# Patient Record
Sex: Male | Born: 1990 | Race: Black or African American | Hispanic: No | Marital: Single | Smoking: Never smoker
Health system: Southern US, Community
[De-identification: ages and names within clinical notes are randomized; demographics above are authoritative.]

## PROBLEM LIST (undated history)

## (undated) DIAGNOSIS — A539 Syphilis, unspecified: Secondary | ICD-10-CM

## (undated) DIAGNOSIS — J45909 Unspecified asthma, uncomplicated: Secondary | ICD-10-CM

## (undated) DIAGNOSIS — F419 Anxiety disorder, unspecified: Secondary | ICD-10-CM

## (undated) DIAGNOSIS — R630 Anorexia: Secondary | ICD-10-CM

## (undated) DIAGNOSIS — F329 Major depressive disorder, single episode, unspecified: Secondary | ICD-10-CM

## (undated) DIAGNOSIS — R001 Bradycardia, unspecified: Secondary | ICD-10-CM

## (undated) DIAGNOSIS — L309 Dermatitis, unspecified: Secondary | ICD-10-CM

## (undated) DIAGNOSIS — F32A Depression, unspecified: Secondary | ICD-10-CM

## (undated) HISTORY — PX: EXTERNAL EAR SURGERY: SHX627

## (undated) HISTORY — DX: Syphilis, unspecified: A53.9

---
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
## 2013-08-29 ENCOUNTER — Other Ambulatory Visit (HOSPITAL_COMMUNITY)
Admission: RE | Admit: 2013-08-29 | Discharge: 2013-08-29 | Disposition: A | Discharge status: Home / Self Care | Payer: Self-pay | Source: Ambulatory Visit | Attending: Family Medicine | Admitting: Family Medicine

## 2013-08-29 ENCOUNTER — Emergency Department (INDEPENDENT_AMBULATORY_CARE_PROVIDER_SITE_OTHER)
Admission: EM | Admit: 2013-08-29 | Discharge: 2013-08-29 | Disposition: A | Discharge status: Home / Self Care | Payer: Self-pay | Source: Home / Self Care | Attending: Family Medicine | Admitting: Family Medicine

## 2013-08-29 ENCOUNTER — Encounter (HOSPITAL_COMMUNITY): Payer: Self-pay | Admitting: Emergency Medicine

## 2013-08-29 DIAGNOSIS — R591 Generalized enlarged lymph nodes: Secondary | ICD-10-CM

## 2013-08-29 DIAGNOSIS — R599 Enlarged lymph nodes, unspecified: Secondary | ICD-10-CM

## 2013-08-29 DIAGNOSIS — Z113 Encounter for screening for infections with a predominantly sexual mode of transmission: Secondary | ICD-10-CM | POA: Insufficient documentation

## 2013-08-29 DIAGNOSIS — A539 Syphilis, unspecified: Secondary | ICD-10-CM

## 2013-08-29 HISTORY — DX: Syphilis, unspecified: A53.9

## 2013-08-29 HISTORY — DX: Unspecified asthma, uncomplicated: J45.909

## 2013-08-29 LAB — CBC WITH DIFFERENTIAL/PLATELET
Basophils Absolute: 0 10*3/uL (ref 0.0–0.1)
Basophils Relative: 1 % (ref 0–1)
Eosinophils Absolute: 0.1 10*3/uL (ref 0.0–0.7)
Eosinophils Relative: 3 % (ref 0–5)
HCT: 46.7 % (ref 39.0–52.0)
Hemoglobin: 15.7 g/dL (ref 13.0–17.0)
Lymphocytes Relative: 51 % — ABNORMAL HIGH (ref 12–46)
Lymphs Abs: 2.7 10*3/uL (ref 0.7–4.0)
MCH: 29.8 pg (ref 26.0–34.0)
MCHC: 33.6 g/dL (ref 30.0–36.0)
MCV: 88.6 fL (ref 78.0–100.0)
Monocytes Absolute: 0.6 10*3/uL (ref 0.1–1.0)
Monocytes Relative: 11 % (ref 3–12)
Neutro Abs: 1.7 10*3/uL (ref 1.7–7.7)
Neutrophils Relative %: 34 % — ABNORMAL LOW (ref 43–77)
Platelets: 266 10*3/uL (ref 150–400)
RBC: 5.27 MIL/uL (ref 4.22–5.81)
RDW: 12.9 % (ref 11.5–15.5)
WBC: 5.1 10*3/uL (ref 4.0–10.5)

## 2013-08-29 LAB — RPR TITER

## 2013-08-29 LAB — RPR: RPR Ser Ql: REACTIVE — AB

## 2013-08-29 LAB — HIV ANTIBODY (ROUTINE TESTING W REFLEX): HIV 1&2 Ab, 4th Generation: NONREACTIVE

## 2013-08-29 MED ORDER — DOXYCYCLINE HYCLATE 100 MG PO TABS: 100.0000 mg | ORAL_TABLET | Freq: Two times a day (BID) | ORAL | Status: DC

## 2013-08-29 NOTE — ED Provider Notes (Signed)
CSN: [id number]     Arrival date & time 08/29/13  1444 History   First MD Initiated Contact with Patient 08/29/13 1455     Chief Complaint  Patient presents with  . Mass   (Consider location/radiation/quality/duration/timing/severity/associated sxs/prior Treatment) HPI  Hip mass on R.: started 8 days ago. Getting larger. Becoming painful. Worse w/ ambulation. Improves w/ rest. Pain comes an dgoes but mass does not go away. Denies fever, rash, n/v. Denies sick contacts. Ibuprofen, adn tylenol w/o benefit. No trauma to hip or surounding area. 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 Asthma    History reviewed. No pertinent past surgical history. No family history on file. History  Substance Use Topics  . Smoking status: Not on file  . Smokeless tobacco: Not on file  . Alcohol Use: Not on file    Review of Systems Per HPI with all other pertinent systems negative.   Allergies  Peanuts  Home Medications   Prior to Admission medications   Medication Sig Start Date End Date Taking? Authorizing Provider  doxycycline (VIBRA-TABS) 100 MG tablet Take 1 tablet (100 mg total) by mouth 2 (two) times daily. 08/29/13   Ozella Rocksavid J Kethan Papadopoulos, MD   BP 124/63  Pulse 65  Temp(Src) 98 F (36.7 C) (Oral)  Resp 18  Ht 5\' 9"  (1.753 m)  Wt 135 lb (61.236 kg)  BMI 19.93 kg/m2  SpO2 100% Physical Exam  Constitutional: He is oriented to person, place, and time. He appears well-developed and well-nourished. No distress.  HENT:  Head: Normocephalic and atraumatic.  Eyes: EOM are normal. Pupils are equal, round, and reactive to light.  Neck: Normal range of motion.  Cardiovascular: Normal rate.   Pulmonary/Chest: Effort normal. No respiratory distress.  Abdominal: Soft.  Genitourinary:  R large grape sized inguinal mass that will not reduce, but is moveable. Inguinal canal patent on standing. L dime sized mass that is also ttp. No LE signs of infection    Musculoskeletal: Normal range of motion.  Neurological: He is alert and oriented to person, place, and time. He exhibits normal muscle tone.  Skin: Skin is warm. No rash noted. He is not diaphoretic.  Psychiatric: He has a normal mood and affect. His behavior is normal. Judgment and thought content normal.    ED Course  Procedures (including critical care time) Labs Review Labs Reviewed  HIV ANTIBODY (ROUTINE TESTING)  RPR  CBC WITH DIFFERENTIAL  URINE CYTOLOGY ANCILLARY ONLY    Imaging Review No results found.   MDM   1. Adenopathy    Likely bilateral inguinal adenopathy. Hernia unlikely as pt passing bowels, young and not a lot of heavy lifting and canal apparently open. Denies sex w/o condoms or recent infections.  GC, Chl, Trich, HIV, RPR Start Doxy, NSAIDs, Ice, Precautions given and all questions answered  Shelly Flattenavid Dinari Stgermaine, MD Family Medicine 08/29/2013, 3:40 PM      Ozella Rocksavid J Jalaysia Lobb, MD 08/29/13 [phone]

## 2013-08-29 NOTE — ED Notes (Signed)
C/o mass on right hip which was noticed 7-8 days ago States pain intense while walking or putting pressure on leg Denies any discharge

## 2013-08-29 NOTE — Discharge Instructions (Signed)
The cause of your swollen glands in your groin region is not clear but may be due to an infection. We will let you know if any of your lab work comes back abnormal Please start the doxycycline for infection Please use ice, rest and ibuprofen 600mg  every 6 hours for pain and swelling.  Please go to the emergency room if you pain does not improve or becomes worse with loss of bowel function.  Lymphadenopathy Lymphadenopathy means "disease of the lymph glands." But the term is usually used to describe swollen or enlarged lymph glands, also called lymph nodes. These are the bean-shaped organs found in many locations including the neck, underarm, and groin. Lymph glands are part of the immune system, which fights infections in your body. Lymphadenopathy can occur in just one area of the body, such as the neck, or it can be generalized, with lymph node enlargement in several areas. The nodes found in the neck are the most common sites of lymphadenopathy. CAUSES When your immune system responds to germs (such as viruses or bacteria ), infection-fighting cells and fluid build up. This causes the glands to grow in size. Usually, this is not something to worry about. Sometimes, the glands themselves can become infected and inflamed. This is called lymphadenitis. Enlarged lymph nodes can be caused by many diseases:  Bacterial disease, such as strep throat or a skin infection.  Viral disease, such as a common cold.  Other germs, such as Lyme disease, tuberculosis, or sexually transmitted diseases.  Cancers, such as lymphoma (cancer of the lymphatic system) or leukemia (cancer of the white blood cells).  Inflammatory diseases such as lupus or rheumatoid arthritis.  Reactions to medications. Many of the diseases above are rare, but important. This is why you should see your caregiver if you have lymphadenopathy. SYMPTOMS  Swollen, enlarged lumps in the neck, back of the head, or other  locations.  Tenderness.  Warmth or redness of the skin over the lymph nodes.  Fever. DIAGNOSIS Enlarged lymph nodes are often near the source of infection. They can help health care providers diagnose your illness. For instance:  Swollen lymph nodes around the jaw might be caused by an infection in the mouth.  Enlarged glands in the neck often signal a throat infection.  Lymph nodes that are swollen in more than one area often indicate an illness caused by a virus. Your caregiver will likely know what is causing your lymphadenopathy after listening to your history and examining you. Blood tests, x-rays, or other tests may be needed. If the cause of the enlarged lymph node cannot be found, and it does not go away by itself, then a biopsy may be needed. Your caregiver will discuss this with you. TREATMENT Treatment for your enlarged lymph nodes will depend on the cause. Many times the nodes will shrink to normal size by themselves, with no treatment. Antibiotics or other medicines may be needed for infection. Only take over-the-counter or prescription medicines for pain, discomfort, or fever as directed by your caregiver. HOME CARE INSTRUCTIONS Swollen lymph glands usually return to normal when the underlying medical condition goes away. If they persist, contact your health-care provider. He/she might prescribe antibiotics or other treatments, depending on the diagnosis. Take any medications exactly as prescribed. Keep any follow-up appointments made to check on the condition of your enlarged nodes. SEEK MEDICAL CARE IF:  Swelling lasts for more than two weeks.  You have symptoms such as weight loss, night sweats, fatigue, or fever that  does not go away.  The lymph nodes are hard, seem fixed to the skin, or are growing rapidly.  Skin over the lymph nodes is red and inflamed. This could mean there is an infection. SEEK IMMEDIATE MEDICAL CARE IF:  Fluid starts leaking from the area of the  enlarged lymph node.  You develop a fever of 102 F (38.9 C) or greater.  Severe pain develops (not necessarily at the site of a large lymph node).  You develop chest pain or shortness of breath.  You develop worsening abdominal pain. MAKE SURE YOU:  Understand these instructions.  Will watch your condition.  Will get help right away if you are not doing well or get worse. Document Released: 10/30/2007 Document Revised: 06/06/2013 Document Reviewed: 10/30/2007 [facility] Patient Information 2015 [place], [place]. This information is not intended to replace advice given to you by your health care provider. Make sure you discuss any questions you have with your health care provider.

## 2013-08-30 ENCOUNTER — Telehealth (HOSPITAL_COMMUNITY): Payer: Self-pay | Admitting: *Deleted

## 2013-08-30 LAB — FLUORESCENT TREPONEMAL AB(FTA)-IGG-BLD: FLUORESCENT TREPONEMAL AB, IGG: REACTIVE — AB

## 2013-08-30 NOTE — ED Notes (Signed)
GC/Chlamydia/Trich neg., HIV non-reactive, RPR reactive, RPR titer 1:2, Fluorescent Treponemal IgG reactive.  Dr. Konrad DoloresMerrell said to call pt. back for Penicillin 2.4 million units.  I tried to call pt. but no answer.  Unable to leave a message.  Call 1.  No other contact numbers. Vassie MoselleYork, Osten Janek M 08/30/2013

## 2013-09-02 NOTE — ED Notes (Addendum)
Pt. needs treatment for Syphilis.  Unable to reach pt. by phone x 3- no answer ([facility] number).  Confidential letter sent with instructions to return for treatment. Vassie MoselleYork, Radin Raptis M 09/02/2013 Merrilee [facility]Jason Hall [facility] called and said he was trying to track pt. down. He asked if pt. Has come in yet for treatment. I said no.  I sent him a letter on 7/31 but he has not come in yet. He said he or Prentice DockerBrandi Sessoms would call back and let me know if [facility]. Vassie MoselleYork, Jentry Warnell M 09/08/2013

## 2013-09-08 ENCOUNTER — Encounter (HOSPITAL_COMMUNITY): Payer: Self-pay | Admitting: Emergency Medicine

## 2013-09-08 ENCOUNTER — Emergency Department (INDEPENDENT_AMBULATORY_CARE_PROVIDER_SITE_OTHER)
Admission: EM | Admit: 2013-09-08 | Discharge: 2013-09-08 | Disposition: A | Discharge status: Home / Self Care | Payer: Self-pay | Source: Home / Self Care | Attending: Emergency Medicine | Admitting: Emergency Medicine

## 2013-09-08 DIAGNOSIS — A510 Primary genital syphilis: Secondary | ICD-10-CM

## 2013-09-08 MED ORDER — PENICILLIN G BENZATHINE 1200000 UNIT/2ML IM SUSP
INTRAMUSCULAR | Status: AC
  Filled 2013-09-08: qty 4

## 2013-09-08 MED ORDER — PENICILLIN G BENZATHINE 1200000 UNIT/2ML IM SUSP
2.4000 10*6.[IU] | Freq: Once | INTRAMUSCULAR | Status: AC
  Administered 2013-09-08: 2.4 10*6.[IU] via INTRAMUSCULAR

## 2013-09-08 NOTE — Discharge Instructions (Signed)
[facility] in [facility] month [facility] or with primary care for repeat titer. No intercourse for 2 weeks and then always with condom.  Repeat HIV test in 3 and 6 months.

## 2013-09-08 NOTE — ED Provider Notes (Addendum)
Chief Complaint   Chief Complaint  Patient presents with  . SEXUALLY TRANSMITTED DISEASE    History of Present Illness   Stanley Moon is a 23 year old male who was seen here on July 27 for an enlarged lymph node in the right inguinal area. Serologies for syphilis, HIV, and urine testing for gonorrhea and Chlamydia were obtained. He was placed on doxycycline. The RPR came back positive at a 1:2 titer. A confirmatory test was positive for fluorescent treponemal antibody. His HIV titer and DNA probes for gonorrhea and Chlamydia were negative. He received our letter in the mail today and came right over here for treatment. The infection has been reported to the health department, and they're on their way over here to interview him. He also has had a two-week history of a sore on his penis. This is mildly tender to palpation. He denies any urethral discharge or dysuria the patient estimates that he has had 5 male partners in the past 3 months. He denies any prior history of STDs.  Review of Systems   Other than as noted above, the patient denies any of the following symptoms: Systemic:  No fevers chills, arthralgias, or adenopathy. GI:  No abdominal pain, nausea or vomiting. GU:  No dysuria, penile pain, discharge, itching, dysuria, genital lesions, testicular pain or swelling. Skin:  No rash or itching.  PMFSH   Past medical history, family history, social history, meds, and allergies were reviewed.   Physical Examination    Vital signs:  BP 114/70  Pulse 72  Temp(Src) 98.6 F (37 C) (Oral)  Resp 18  SpO2 100% Gen:  Alert, oriented, in no distress. Abdomen:  Soft and flat, non-distended, and non-tender.  No organomegaly or mass. Genital:  There is a 1 cm, mildly tender ulceration on the right side of the glans penis. No urethral discharge. No other penile lesions there is a 1 cm, tender, freely movable lymph node in the right inguinal area. Skin:  Warm and dry.  No rash.   Labs    Results for orders placed during the hospital encounter of 08/29/13  HIV ANTIBODY (ROUTINE TESTING)      Result Value Ref Range   HIV 1&2 Ab, 4th Generation NONREACTIVE  NONREACTIVE  RPR      Result Value Ref Range   RPR Reactive (*) NON REAC  CBC WITH DIFFERENTIAL      Result Value Ref Range   WBC 5.1  4.0 - 10.5 K/uL   RBC 5.27  4.22 - 5.81 MIL/uL   Hemoglobin 15.7  13.0 - 17.0 g/dL   HCT 16.1  09.6 - 04.5 %   MCV 88.6  78.0 - 100.0 fL   MCH 29.8  26.0 - 34.0 pg   MCHC 33.6  30.0 - 36.0 g/dL   RDW 40.9  81.1 - 91.4 %   Platelets 266  150 - 400 K/uL   Neutrophils Relative % 34 (*) 43 - 77 %   Neutro Abs 1.7  1.7 - 7.7 K/uL   Lymphocytes Relative 51 (*) 12 - 46 %   Lymphs Abs 2.7  0.7 - 4.0 K/uL   Monocytes Relative 11  3 - 12 %   Monocytes Absolute 0.6  0.1 - 1.0 K/uL   Eosinophils Relative 3  0 - 5 %   Eosinophils Absolute 0.1  0.0 - 0.7 K/uL   Basophils Relative 1  0 - 1 %   Basophils Absolute 0.0  0.0 - 0.1 K/uL  RPR  TITER      Result Value Ref Range   RPR Titer 1:2 (*) NON REACTIVE  FLUORESCENT TREPONEMAL AB(FTA)-IGG-BLD      Result Value Ref Range   Fluorescent Treponemal AB, IgG Reactive (*)      Course in Urgent Care Center   Given LA Bicillin 2.4 million units IM.   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  No evidence of secondary or tertiary syphilis.  Plan    1.  Meds:  The following meds were prescribed:   New Prescriptions   No medications on file    2.  Patient Education/Counseling:  The patient was given appropriate handouts, self care instructions, and instructed in symptomatic relief.The patient was instructed to inform all sexual contacts, avoid intercourse completely for 2 weeks and then only with a condom.  The patient was told that we would call about all abnormal lab results, and that we would need to report certain kinds of infection to the health department.  Suggested that he return to the health department in one  month for repeat titer. Also should avoid intercourse completely for the next 2 weeks, thereafter always use a condom.  3.  Follow up:  The patient was told to follow up here if no better in 3 to 4 days, or sooner if becoming worse in any way, and given some red flag symptoms such as fever, pain, or difficulty urinating which would prompt immediate return.       Reuben Likesavid C Jaramiah Bossard, MD 09/08/13 1640  Reuben Likesavid C Erandy Mceachern, MD 09/08/13 [phone]

## 2013-09-08 NOTE — ED Notes (Signed)
I called [facility] and notified her that pt was here now for treatment with 2.4 million units of PCN.  She will add that to his form. I told her that Merrilee JanskyJason Hall was coming here to talk to the pt. Vassie MoselleYork, Danyella Mcginty M 09/08/2013

## 2013-09-08 NOTE — ED Notes (Signed)
Investigator  From  Health       Dept  In     To  Speak  With  Patient

## 2013-09-08 NOTE — ED Notes (Signed)
I called Stanley JanskyJason Moon back and let him know pt. had just checked in and was here now for treatment.  He said he would be her in 10 min. to interview him. Stanley Moon, Stanley Moon 09/08/2013

## 2013-09-08 NOTE — ED Notes (Signed)
Plan of  Care  Discussed  With   Pt   Who  Verbalizes  Knowledge

## 2013-09-08 NOTE — ED Notes (Signed)
Pt        Reports      He  Was  Told  By  Health  Dept  He  Needed  To  Be  Treated  For a  Pos  Vdrl

## 2013-09-09 LAB — RPR TITER: RPR Titer: 1:16 {titer} — AB

## 2013-09-09 LAB — RPR: RPR: REACTIVE — AB

## 2013-09-09 NOTE — ED Notes (Addendum)
The RPR was  redrawn on 8/6 and was reactive.  The titer went up to 1:16.  Pt. was treated on 8/6 with 2.4 million units of PCN. Stanley Moon, Stanley Moon M 09/09/2013 Stanley DockerBrandi Moon [facility] notified of increased titer done on 8/6.  She will add it to current form. Stanley Moon, Archibald Marchetta M 09/12/2013

## 2013-09-12 LAB — FLUORESCENT TREPONEMAL AB(FTA)-IGG-BLD: FLUORESCENT TREPONEMAL AB, IGG: REACTIVE — AB

## 2015-11-04 ENCOUNTER — Encounter (HOSPITAL_COMMUNITY): Payer: Self-pay

## 2015-11-04 ENCOUNTER — Emergency Department (HOSPITAL_COMMUNITY)
Admission: EM | Admit: 2015-11-04 | Discharge: 2015-11-05 | Disposition: A | Discharge status: Other Acute Inpatient Hospital | Payer: Federal, State, Local not specified - Other | Attending: Emergency Medicine | Admitting: Emergency Medicine

## 2015-11-04 DIAGNOSIS — Z91010 Allergy to peanuts: Secondary | ICD-10-CM | POA: Insufficient documentation

## 2015-11-04 DIAGNOSIS — J45909 Unspecified asthma, uncomplicated: Secondary | ICD-10-CM | POA: Insufficient documentation

## 2015-11-04 DIAGNOSIS — F322 Major depressive disorder, single episode, severe without psychotic features: Secondary | ICD-10-CM | POA: Diagnosis present

## 2015-11-04 DIAGNOSIS — Z79899 Other long term (current) drug therapy: Secondary | ICD-10-CM | POA: Insufficient documentation

## 2015-11-04 NOTE — ED Provider Notes (Signed)
[facility]-EMERGENCY DEPT Provider Note   CSN: [id number] Arrival date & time: 11/04/15  2112  By signing my name below, I, Rosario Adie, attest that this documentation has been prepared under the direction and in the presence of Earley Favor, FNP. Electronically Signed: Rosario Adie, ED Scribe. 11/04/15. 10:02 PM.  History   Chief Complaint Chief Complaint  Patient presents with  . IVC   The history is provided by the patient. No language interpreter was used.   HPI Comments: Stanley Moon is a 25 y.o. male with no other pertinent PMHx, who presents to the Emergency Department brought in under IVC for evaluation. Pt notes that his mother has been recently very concerned about him. He states that she thinks he has psychiatric issues and him to speak to a psychiatrist. When pressed, pt states that his mother very worried because he does not eat enough daily. However, he denies any recent weight change or decreased appetite and notes that he has had normal PO intake otherwise. He does not currently take medications. No recent illnesses. Denies illicit drug or alcohol usage.   Per IVC paperwork, mother petitioned to place the pt under IVC because he has been increasingly more withdrawn from his life lately and has she has noticed that he has lost ~35lbs over the past 4-5 months. Today, prior to his arrival in the ED, mother found a letter that stated "majority of the time I want to commit suicide". Additional, his paperwork notes that he has been smoking marijuana recently.   Denies self-injury, wound involvement, pain otherwise, sleep disturbance, SI/HI, auditory/visual hallucinations, or any other associated symptoms.   Past Medical History:  Diagnosis Date  . Asthma    as a child, "I haven't used inhalers in years"   Patient Active Problem List   Diagnosis Date Noted  . Major depressive disorder, single episode, severe (HCC) 11/05/2015  . Major depressive disorder, single  episode, severe without psychotic features (HCC) 11/05/2015  . Anorexia nervosa 11/05/2015   History reviewed. No pertinent surgical history.  Home Medications    Prior to Admission medications   Medication Sig Start Date End Date Taking? Authorizing Provider  Multiple Vitamin (MULTIVITAMIN WITH MINERALS) TABS tablet Take 2 tablets by mouth at bedtime.   Yes Historical Provider, MD  doxycycline (VIBRA-TABS) 100 MG tablet Take 1 tablet (100 mg total) by mouth 2 (two) times daily. Patient not taking: Reported on 11/04/2015 08/29/13   Ozella Rocks, MD   Family History No family history on file.  Social History Social History  Substance Use Topics  . Smoking status: Never Smoker  . Smokeless tobacco: Never Used  . Alcohol use No   Allergies   Peanuts [peanut oil] and Lactose intolerance (gi)  Review of Systems Review of Systems  Constitutional: Negative for appetite change and unexpected weight change.  HENT: Negative for ear pain.   Eyes: Negative for pain.  Cardiovascular: Negative for chest pain.  Gastrointestinal: Negative for abdominal pain and rectal pain.  Genitourinary: Negative for flank pain, penile pain and testicular pain.  Musculoskeletal: Negative for arthralgias, back pain, myalgias and neck pain.  Skin: Negative for wound.  Neurological: Negative for headaches.  Psychiatric/Behavioral: Negative for hallucinations, self-injury, sleep disturbance and suicidal ideas.  All other systems reviewed and are negative.  Physical Exam Updated Vital Signs BP 120/71   Pulse 63   Temp 98.1 F (36.7 C)   Resp 16   Ht 5\' 10"  (1.778 m)   SpO2 100%  Physical Exam  Constitutional: He appears well-developed and well-nourished.  HENT:  Head: Normocephalic.  Eyes: Conjunctivae are normal.  Cardiovascular: Normal rate.   Pulmonary/Chest: Effort normal. No respiratory distress.  Abdominal: He exhibits no distension.  Musculoskeletal: Normal range of motion.    Neurological: He is alert.  Skin: Skin is warm and dry.  Psychiatric: He has a normal mood and affect. His behavior is normal.  Nursing note and vitals reviewed.  ED Treatments / Results  DIAGNOSTIC STUDIES: Oxygen Saturation is 100% on RA, normal by my interpretation.   COORDINATION OF CARE: 9:59 PM-Discussed next steps with pt. Pt verbalized understanding and is agreeable with the plan.   Labs (all labs ordered are listed, but only abnormal results are displayed) Labs Reviewed  URINE RAPID DRUG SCREEN, HOSP PERFORMED - Abnormal; Notable for the following:       Result Value   Tetrahydrocannabinol POSITIVE (*)    All other components within normal limits  HEPATIC FUNCTION PANEL - Abnormal; Notable for the following:    Total Protein 8.8 (*)    Albumin 5.1 (*)    Bilirubin, Direct <0.1 (*)    All other components within normal limits  I-STAT CHEM 8, ED - Abnormal; Notable for the following:    Chloride 98 (*)    All other components within normal limits  CBC WITH DIFFERENTIAL/PLATELET  ETHANOL  TSH  T3  T4  RAPID HIV SCREEN (HIV 1/2 AB+AG)  RPR    EKG  EKG Interpretation None      Radiology No results found.  Procedures Procedures (including critical care time)  Medications Ordered in ED Medications - No data to display  Initial Impression / Assessment and Plan / ED Course  I have reviewed the triage vital signs and the nursing notes.  Pertinent labs & imaging results that were available during my care of the patient were reviewed by me and considered in my medical decision making (see chart for details).  Clinical Course      Final Clinical Impressions(s) / ED Diagnoses   Final diagnoses:  Major depressive disorder, single episode, severe (HCC)   New Prescriptions Discharge Medication List as of 11/05/2015  1:49 PM      I personally performed the services described in this documentation, which was scribed in my presence. The recorded  information has been reviewed and is accurate.    Earley FavorGail Breeona Waid, NP 11/07/15 [phone]    Alvira MondayErin Schlossman, MD 11/08/15 1308

## 2015-11-04 NOTE — ED Notes (Signed)
Bed: [facility] Expected date:  Expected time:  Means of arrival:  Comments: 

## 2015-11-04 NOTE — ED Triage Notes (Signed)
Patient IVC'd by mom.  States that patient does not have a Hx of mental illness but, patient is withdraw, sleeps on the floor, and does not communicate with others.  Mother found a poem that "I want to commit suicide" Letter/Poem is with patients papers.  Mother states that patient has lost 35 pounds in the last 4 or 5 months.

## 2015-11-04 NOTE — ED Notes (Signed)
Mother Brayton CavesZarassa Zee Smigel [phone] and father Arrie SenateBrian Clay Morgan [phone]

## 2015-11-05 ENCOUNTER — Inpatient Hospital Stay (HOSPITAL_COMMUNITY)
Admission: AD | Admit: 2015-11-05 | Discharge: 2015-11-15 | DRG: 885 | Disposition: A | Discharge status: Home / Self Care | Payer: Federal, State, Local not specified - Other | Attending: Psychiatry | Admitting: Psychiatry

## 2015-11-05 ENCOUNTER — Encounter (HOSPITAL_COMMUNITY): Payer: Self-pay

## 2015-11-05 DIAGNOSIS — F411 Generalized anxiety disorder: Secondary | ICD-10-CM | POA: Diagnosis present

## 2015-11-05 DIAGNOSIS — F5001 Anorexia nervosa, restricting type: Secondary | ICD-10-CM | POA: Diagnosis present

## 2015-11-05 DIAGNOSIS — F322 Major depressive disorder, single episode, severe without psychotic features: Secondary | ICD-10-CM | POA: Diagnosis present

## 2015-11-05 DIAGNOSIS — Z681 Body mass index (BMI) 19 or less, adult: Secondary | ICD-10-CM | POA: Diagnosis not present

## 2015-11-05 DIAGNOSIS — Z91010 Allergy to peanuts: Secondary | ICD-10-CM | POA: Diagnosis not present

## 2015-11-05 DIAGNOSIS — I951 Orthostatic hypotension: Secondary | ICD-10-CM | POA: Diagnosis present

## 2015-11-05 DIAGNOSIS — R64 Cachexia: Secondary | ICD-10-CM | POA: Diagnosis present

## 2015-11-05 DIAGNOSIS — G4700 Insomnia, unspecified: Secondary | ICD-10-CM | POA: Diagnosis present

## 2015-11-05 DIAGNOSIS — F519 Sleep disorder not due to a substance or known physiological condition, unspecified: Secondary | ICD-10-CM | POA: Diagnosis present

## 2015-11-05 DIAGNOSIS — F5000 Anorexia nervosa, unspecified: Secondary | ICD-10-CM | POA: Diagnosis present

## 2015-11-05 DIAGNOSIS — R45851 Suicidal ideations: Secondary | ICD-10-CM

## 2015-11-05 DIAGNOSIS — Z79899 Other long term (current) drug therapy: Secondary | ICD-10-CM | POA: Diagnosis not present

## 2015-11-05 LAB — RAPID URINE DRUG SCREEN, HOSP PERFORMED
Amphetamines: NOT DETECTED
BARBITURATES: NOT DETECTED
BENZODIAZEPINES: NOT DETECTED
COCAINE: NOT DETECTED
OPIATES: NOT DETECTED
Tetrahydrocannabinol: POSITIVE — AB

## 2015-11-05 LAB — I-STAT CHEM 8, ED
BUN: 7 mg/dL (ref 6–20)
CREATININE: 0.8 mg/dL (ref 0.61–1.24)
Calcium, Ion: 1.23 mmol/L (ref 1.15–1.40)
Chloride: 98 mmol/L — ABNORMAL LOW (ref 101–111)
Glucose, Bld: 75 mg/dL (ref 65–99)
HCT: 47 % (ref 39.0–52.0)
Hemoglobin: 16 g/dL (ref 13.0–17.0)
POTASSIUM: 3.7 mmol/L (ref 3.5–5.1)
SODIUM: 139 mmol/L (ref 135–145)
TCO2: 29 mmol/L (ref 0–100)

## 2015-11-05 LAB — CBC WITH DIFFERENTIAL/PLATELET
BASOS ABS: 0 10*3/uL (ref 0.0–0.1)
BASOS PCT: 1 %
EOS ABS: 0.1 10*3/uL (ref 0.0–0.7)
Eosinophils Relative: 3 %
HEMATOCRIT: 42.1 % (ref 39.0–52.0)
HEMOGLOBIN: 15.1 g/dL (ref 13.0–17.0)
Lymphocytes Relative: 47 %
Lymphs Abs: 2.5 10*3/uL (ref 0.7–4.0)
MCH: 29.7 pg (ref 26.0–34.0)
MCHC: 35.9 g/dL (ref 30.0–36.0)
MCV: 82.7 fL (ref 78.0–100.0)
Monocytes Absolute: 0.3 10*3/uL (ref 0.1–1.0)
Monocytes Relative: 5 %
NEUTROS ABS: 2.3 10*3/uL (ref 1.7–7.7)
NEUTROS PCT: 44 %
Platelets: 205 10*3/uL (ref 150–400)
RBC: 5.09 MIL/uL (ref 4.22–5.81)
RDW: 13.2 % (ref 11.5–15.5)
WBC: 5.3 10*3/uL (ref 4.0–10.5)

## 2015-11-05 LAB — TSH: TSH: 1.685 u[IU]/mL (ref 0.350–4.500)

## 2015-11-05 LAB — HEPATIC FUNCTION PANEL
ALBUMIN: 5.1 g/dL — AB (ref 3.5–5.0)
ALT: 23 U/L (ref 17–63)
AST: 26 U/L (ref 15–41)
Alkaline Phosphatase: 49 U/L (ref 38–126)
Total Bilirubin: 1.2 mg/dL (ref 0.3–1.2)
Total Protein: 8.8 g/dL — ABNORMAL HIGH (ref 6.5–8.1)

## 2015-11-05 LAB — RAPID HIV SCREEN (HIV 1/2 AB+AG)
HIV 1/2 Antibodies: NONREACTIVE
HIV-1 P24 Antigen - HIV24: NONREACTIVE

## 2015-11-05 LAB — ETHANOL: Alcohol, Ethyl (B): <5 mg/dL (ref <5)

## 2015-11-05 MED ORDER — ONDANSETRON HCL 4 MG PO TABS: 4.0000 mg | ORAL_TABLET | Freq: Three times a day (TID) | ORAL | Status: DC | PRN

## 2015-11-05 MED ORDER — ALUM & MAG HYDROXIDE-SIMETH 200-200-20 MG/5ML PO SUSP: 30.0000 mL | ORAL | Status: DC | PRN

## 2015-11-05 MED ORDER — ENSURE ENLIVE PO LIQD: 237.0000 mL | Freq: Two times a day (BID) | ORAL | Status: DC

## 2015-11-05 MED ORDER — FLUOXETINE HCL 10 MG PO CAPS
10.0000 mg | ORAL_CAPSULE | Freq: Every day | ORAL | Status: DC
Start: 2015-11-05 — End: 2015-11-05
  Administered 2015-11-05: 10 mg via ORAL
  Filled 2015-11-05: qty 1

## 2015-11-05 MED ORDER — HYDROXYZINE HCL 25 MG PO TABS
25.0000 mg | ORAL_TABLET | Freq: Three times a day (TID) | ORAL | Status: DC | PRN
  Administered 2015-11-07: 25 mg via ORAL
  Filled 2015-11-05: qty 10
  Filled 2015-11-05: qty 1

## 2015-11-05 MED ORDER — LORAZEPAM 1 MG PO TABS: 1.0000 mg | ORAL_TABLET | Freq: Three times a day (TID) | ORAL | Status: DC | PRN

## 2015-11-05 MED ORDER — MAGNESIUM HYDROXIDE 400 MG/5ML PO SUSP: 30.0000 mL | Freq: Every day | ORAL | Status: DC | PRN

## 2015-11-05 MED ORDER — MIRTAZAPINE 15 MG PO TABS
15.0000 mg | ORAL_TABLET | Freq: Every day | ORAL | Status: DC
  Administered 2015-11-07 – 2015-11-14 (×7): 15 mg via ORAL
  Filled 2015-11-05: qty 1
  Filled 2015-11-05: qty 7
  Filled 2015-11-05 (×10): qty 1
  Filled 2015-11-05: qty 7
  Filled 2015-11-05 (×2): qty 1

## 2015-11-05 MED ORDER — THIAMINE HCL 100 MG/ML IJ SOLN
100.0000 mg | Freq: Every day | INTRAMUSCULAR | Status: AC
Start: 2015-11-05 — End: 2015-11-06
  Administered 2015-11-05 – 2015-11-06 (×2): 100 mg via INTRAMUSCULAR
  Filled 2015-11-05 (×2): qty 2

## 2015-11-05 MED ORDER — VITAMIN B-1 100 MG PO TABS
100.0000 mg | ORAL_TABLET | Freq: Every day | ORAL | Status: AC
  Administered 2015-11-07 – 2015-11-13 (×7): 100 mg via ORAL
  Filled 2015-11-05 (×7): qty 1

## 2015-11-05 MED ORDER — FLUOXETINE HCL 10 MG PO CAPS
10.0000 mg | ORAL_CAPSULE | Freq: Every day | ORAL | Status: DC
  Filled 2015-11-05: qty 1

## 2015-11-05 MED ORDER — HYDROXYZINE HCL 25 MG PO TABS: 25.0000 mg | ORAL_TABLET | Freq: Three times a day (TID) | ORAL | Status: DC | PRN

## 2015-11-05 MED ORDER — ACETAMINOPHEN 325 MG PO TABS: 650.0000 mg | ORAL_TABLET | Freq: Four times a day (QID) | ORAL | Status: DC | PRN

## 2015-11-05 MED ORDER — MIRTAZAPINE 30 MG PO TABS: 15.0000 mg | ORAL_TABLET | Freq: Every day | ORAL | Status: DC

## 2015-11-05 NOTE — [facility] Suicide Risk Assessment (Signed)
[facility]   Nursing information obtained from:   pt Demographic factors:   younger male Current Mental Status:   see MSE Loss Factors:   job Historical Factors:   none Risk Reduction Factors:   future oriented  Total Time spent with patient: 45 minutes Principal Problem: <principal problem not specified> Diagnosis:   Patient Active Problem List   Diagnosis Date Noted  . Major depressive disorder, single episode, severe (HCC) [F32.2] 11/05/2015  . 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 Denies any mania, OCD, depressive symptoms, anxiety or any psychotic symptoms including auditory hallucinations, ideas of reference, paranoia or delusions. He denies any history of worse current symptoms of mania. He does endorse symptoms consistent with anorexia nervosa restricting type.  Continued Clinical Symptoms:    The "Alcohol Use Disorders Identification Test", Guidelines for Use in Primary Care, Second Edition.  World Science writerHealth Organization [facility](WHO). Score between 0-7:  no or low risk or alcohol related problems. Score between 8-15:  moderate risk of alcohol related problems. Score between 16-19:  high risk of alcohol related problems. Score 20 or above:  warrants further diagnostic evaluation for alcohol dependence and treatment.   CLINICAL FACTORS:   Anorexia Nervosa   Musculoskeletal: Strength & Muscle Tone: within normal limits Gait & Station: normal Patient leans: N/A  Psychiatric Specialty Exam: Physical Exam  ROS  Blood pressure 110/87, pulse 83, temperature 98.1 F (36.7 C), temperature source Oral, resp. rate 18, height 5' 10.5" (1.791 m), weight 46.7 kg (103 lb), SpO2 100 %.Body mass index is 14.57 kg/m.  General Appearance: Casual  Eye Contact:  Minimal  Speech:  Clear and Coherent  Volume:  Normal   Mood:  Euthymic  Affect:  Congruent  Thought Process:  Coherent  Orientation:  Full (Time, Place, and Person)  Thought Content:  Negative  Suicidal Thoughts:  No  Homicidal Thoughts:  No  Memory:  Immediate;   Good Recent;   Good Remote;   Good  Judgement:  Impaired  Insight:  Fair  Psychomotor Activity:  Normal  Concentration:  Concentration: Good and Attention Span: Good  Recall:  Good  Fund of Knowledge:  Good  Language:  Good  Akathisia:  No  Handed:  Right  AIMS (if indicated):     Assets:  Communication Skills Desire for Improvement Housing  ADL's:  Intact  Cognition:  WNL  Sleep:         COGNITIVE FEATURES THAT CONTRIBUTE TO RISK:  Polarized thinking    SUICIDE RISK:   Minimal: No identifiable suicidal ideation.  Patients presenting with no risk factors but with morbid ruminations; may be classified as minimal risk based on the severity of the depressive symptoms   PLAN OF CARE: see PAA  I certify that inpatient services furnished can reasonably be expected to improve the patient's condition.  Acquanetta SitElizabeth Woods Jennalynn Rivard, MD 11/05/2015, 3:42 PM

## 2015-11-05 NOTE — BH Assessment (Addendum)
Tele Assessment Note   Stanley Moon is an 25 y.o. single male who presents to [facility] ED after being petitioned for involuntary commitment by his mother, Stanley Moon [phone]) [phone]. Affidavit and Petition states: "25 YO male with no history of mental illness. Admits to smoking marijuana. Petitioner states respondent is withdrawn, sleeps on the floor, does not communicate with other, shows no emotions and has lost 35 pound in the past 4-5 months. Today, Petitioner found a poem/letter that respondent wrote sating, "Majority  Of the time I want to commit suicide, Look myself in eyes and don't recognize what's inside. So much anger and angst. Hurting and destroying those I love the most. Life isn't worth. Nobody deserves to receive my hurt. In depression deeper than a river. My thoughts more grim than the reaper." Respondent is a danger to self."   Pt says he has been brought for evaluation against his will because his mother is concerned about his weight. He reports he moved in with his mother a few weeks ago and prior to that they had a strained relationship. Pt describes his mother as caring but wanting to treat Pt as a teenager even though he is 79twenty-three years old. Pt denies depressive symptoms or problems with sleep. He acknowledges he has lost a significant amount of weight and says he hasn't had money for food.  He denies homicidal ideation or history of violence. He denies any history of psychotic symptoms. Pt denies alcohol or substance abuse, including marijuana, and Pt's urine drug screen is in process.  Pt denies current suicidal ideation or a history of suicide attempts. He states he did write the poem one year ago and was feeling depressed at that time. He says he was cleaning out some of his belongings and was throwing away the notebook containing the poem and his mother found it. He denies any history of intentional self-injurious behaviors.   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 He denies any specific stressors other than the transition of living with his mother. He identifies his mother, father, grandmother and brother as supports. He denies any history of inpatient or outpatient mental health or substance abuse treatment. He denies any history of abuse or trauma. He denies any legal problems.  Pt is dressed in hospital scrubs, alert, oriented x4 with normal speech and normal motor behavior. Pt answered some questions with excessive detail. Eye contact is minimal and Pt rarely looked with the camera. Pt describes his mood as good and affect is anxious. Thought process is coherent and relevant. There is no indication Pt is currently responding to internal stimuli or experiencing delusional thought content. Pt was cooperative throughout assessment. He says he doesn't believe he has any mental health issues.   Diagnosis: Deferred  Past Medical History:  Past Medical History:  Diagnosis Date  . Asthma     History reviewed. No pertinent surgical history.  Family History: No family history on file.  Social History:  reports that he has never smoked. He has never used smokeless tobacco. He reports that he does not drink alcohol or use drugs.  Additional Social History:  Alcohol / Drug Use Pain Medications: Denies use Prescriptions: Denies use Over the Counter: Denies abuse History of alcohol / drug use?: No history of alcohol / drug abuse (Mother reports Pt is using marijuana, Pt denies.) Longest period of sobriety (when/how long): NA  CIWA: CIWA-Ar BP: 141/84 Pulse Rate: 65 COWS:  PATIENT STRENGTHS: (choose at least two) Ability for insight Average or above average intelligence Capable of independent living Communication skills General fund of knowledge Physical Health Special hobby/interest Supportive family/friends Work skills  Allergies:  Allergies  Allergen Reactions  . Peanuts [Peanut Oil] Anaphylaxis    Home  Medications:  (Not in a hospital admission)  OB/GYN Status:  No LMP for male patient.  General Assessment Data Location of Assessment: [facility] ED TTS Assessment: In system Is this a Tele or Face-to-Face Assessment?: Tele Assessment Is this an Initial Assessment or a Re-assessment for this encounter?: Initial Assessment Marital status: Single Maiden name: NA Is patient pregnant?: No Pregnancy Status: No Living Arrangements: Parent (Living with mother) Can pt return to current living arrangement?: Yes Admission Status: Involuntary Is patient capable of signing voluntary admission?: Yes Referral Source: Self/Family/Friend Insurance type: Self-pay     Crisis Care Plan Living Arrangements: Parent (Living with mother) Legal Guardian: Other: (Self) Name of Psychiatrist: None Name of Therapist: None  Education Status Is patient currently in school?: No Current Grade: NA Highest grade of school patient has completed: 12 Name of school: NA Contact person: NA  Risk to self with the past 6 months Suicidal Ideation: No (Mother says Pt has expressed SI, Pt denies) Has patient been a risk to self within the past 6 months prior to admission? : No Suicidal Intent: No Has patient had any suicidal intent within the past 6 months prior to admission? : No Is patient at risk for suicide?: No Suicidal Plan?: No Has patient had any suicidal plan within the past 6 months prior to admission? : No Access to Means: No What has been your use of drugs/alcohol within the last 12 months?: Mother reports Pt is using marijuana, Pt denies Previous Attempts/Gestures: No How many times?: 0 Other Self Harm Risks: Mother reports Pt has lost a significant amount of weight Triggers for Past Attempts: None known Intentional Self Injurious Behavior: None Family Suicide History: No Recent stressful life event(s): Conflict (Comment) (Conflict with mother) Persecutory voices/beliefs?: No Depression:  No Depression Symptoms:  (Pt denies depressive symptoms) Substance abuse history and/or treatment for substance abuse?: No Suicide prevention information given to non-admitted patients: Not applicable  Risk to Others within the past 6 months Homicidal Ideation: No Does patient have any lifetime risk of violence toward others beyond the six months prior to admission? : No Thoughts of Harm to Others: No Current Homicidal Intent: No Current Homicidal Plan: No Access to Homicidal Means: No Identified Victim: None History of harm to others?: No Assessment of Violence: None Noted Violent Behavior Description: Pt denies history of violence or aggression Does patient have access to weapons?: No Criminal Charges Pending?: No Does patient have a court date: No Is patient on probation?: No  Psychosis Hallucinations: None noted Delusions: None noted  Mental Status Report Appearance/Hygiene: In scrubs Eye Contact: Poor Motor Activity: Unremarkable Speech: Logical/coherent Level of Consciousness: Alert Mood: Anxious Affect: Appropriate to circumstance Anxiety Level: Minimal Thought Processes: Coherent, Relevant Judgement: Unimpaired Orientation: Person, Place, Time, Situation, Appropriate for developmental age Obsessive Compulsive Thoughts/Behaviors: None  Cognitive Functioning Concentration: Normal Memory: Recent Intact, Remote Intact IQ: Average Insight: Fair Impulse Control: Good Appetite: Poor Weight Loss: 30 Weight Gain: 0 Sleep: No Change Total Hours of Sleep: 7 Vegetative Symptoms: None  ADLScreening [facility] Assessment Services) Patient's cognitive ability adequate to safely complete daily activities?: Yes Patient able to express need for assistance with ADLs?: Yes Independently performs ADLs?: Yes (appropriate for developmental age)  Prior Inpatient Therapy Prior Inpatient Therapy: No Prior Therapy Dates: NA Prior Therapy Facilty/Provider(s): NA Reason for  Treatment: NA  Prior Outpatient Therapy Prior Outpatient Therapy: No Prior Therapy Dates: NA Prior Therapy Facilty/Provider(s): NA Reason for Treatment: NA Does patient have an ACCT team?: No Does patient have Intensive In-House Services?  : No Does patient have [facility] services? : No Does patient have P4CC services?: No  ADL Screening (condition at time of admission) Patient's cognitive ability adequate to safely complete daily activities?: Yes Is the patient deaf or have difficulty hearing?: No Does the patient have difficulty seeing, even when wearing glasses/contacts?: No Does the patient have difficulty concentrating, remembering, or making decisions?: No Patient able to express need for assistance with ADLs?: Yes Does the patient have difficulty dressing or bathing?: No Independently performs ADLs?: Yes (appropriate for developmental age) Does the patient have difficulty walking or climbing stairs?: No Weakness of Legs: None Weakness of Arms/Hands: None  Home Assistive Devices/Equipment Home Assistive Devices/Equipment: None    Abuse/Neglect Assessment (Assessment to be complete while patient is alone) Physical Abuse: Denies Verbal Abuse: Denies Sexual Abuse: Denies Exploitation of patient/patient's resources: Denies Self-Neglect: Denies     Merchant navy officer (For Healthcare) Does patient have an advance directive?: No Would patient like information on creating an advanced directive?: No - patient declined information    Additional Information 1:1 In Past 12 Months?: No CIRT Risk: No Elopement Risk: No Does patient have medical clearance?: No     Disposition: Brook McNichol, AC at [facility], confirms adult unit is currently at capacity. Gave clinical report to Nira Conn, NP who recommended Pt be evaluated by psychiatry in the morning. Notified Earley Favor, NP and Dietrich Pates, RN of recommendation.  Disposition Initial Assessment Completed for this  Encounter: Yes Disposition of Patient: Other dispositions Other disposition(s): Other (Comment)   Pamalee Leyden, [facility], [facility], [facility] Triage Specialist [phone]   Patsy Baltimore, Harlin Rain 11/05/2015 1:43 AM

## 2015-11-05 NOTE — H&P (Signed)
Psychiatric Admission Assessment Adult  Patient Identification: Stanley Moon MRN:  [id number] Date of Evaluation:  11/05/2015 Chief Complaint:  DEPRESSION Principal Diagnosis: Anorexia nervosa Diagnosis:   Patient Active Problem List   Diagnosis Date Noted  . Major depressive disorder, single episode, severe (HCC) [F32.2] 11/05/2015  . 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 Anorexia nervosa [F50.00] 11/05/2015   History of Present Illness: Mr. Sliker was petitioned by his mother who noted that he appeared to be isolated and withdrawn and had lost a considerable amount of weight and she was concerned that he was depressed and possibly suicidal. Mr. Blanchfield denies any suicidal ideation, plan or intent at any time. He denies any homicidal ideation, plan or intent. He denies any specific depression or anxiety. He denies any OCD symptoms stating "I don't have OCD" when asked some specific symptoms. He denies auditory hallucinations, visual hallucinations, ideas of reference, thought broadcasting, thought insertion, ESP, or paranoia. He denies any symptoms of mania such as decreased sleep, racing thoughts, flight of ideas, or impulsive behavior. He describes his mood as "level." He does appear to be significantly underweight and when questioned he reports when asked what he eats for breakfast "I'm not a breakfast eater." When asked about lunch he says "I don't typically eat lunch." And asked about dinner he says "I'm moving a lot," but he mentions that he sometimes eats pizza, potato chips or popcorn and also he will try to eat vegetables "a green and orange and a yellow." He denies that he has a problem with under utilizing food stating that he isn't "drained" "staggering" or "weak." As "as far as my dieting." He denies any binge eating or use of purge attempts or deliberate vomiting. He reports that he has never been  a "large eater" and that  he tries not to eat things such as fried foods and if they go to a party or barbecue he will tell his mother you might as well "wrap my plate up to go" he states "I don't eat for the pleasure of eating." He does not identify a particular weight that he wants to weigh but does not feel that his weight is a problem. He reports that he is always very physically active "I'm heavily active." When asked when concept of an eating disorder is explained to him Mr. Sian does not completely reject the idea stating "it makes sense."  Mr. Hoefling denies any past psychiatric history either inpatient or outpatient and he denies any prior use of psychiatric medications. He denies any history of past self-harm and shows me his arms stating that he has no marks, bruises or cuts on them and this is because he does not do these things.  He denies any trauma history.  He denies any substance use history, although his mother reports he may smoke marijuana. Associated Signs/Symptoms:  Depression Symptoms:  weight loss, (Hypo) Manic Symptoms:  denies Anxiety Symptoms:  denies Psychotic Symptoms:  denies PTSD Symptoms: Negative Total Time spent with patient: 1 hour  Past Psychiatric History: none  Is the patient at risk to self? Yes.    Has the patient been a risk to self in the past 6 months? Yes.    Has the patient been a risk to self within the distant past? No.  Is the patient a risk to others? No.  Has the patient been a risk to others in the past 6 months? No.  Has the patient been  a risk to others within the distant past? No.   Prior Inpatient Therapy:  denies  Prior Outpatient Therapy:  denies  Alcohol Screening:   Substance Abuse History in the last 12 months:  No. Consequences of Substance Abuse: NA Previous Psychotropic Medications: No  Psychological Evaluations: No  Past Medical History:  Past Medical History:  Diagnosis Date  . Asthma    No past surgical history on file. Family History:  No family history on file. Family Psychiatric  History: none Tobacco Screening:   Social History:  History  Alcohol Use No     History  Drug Use No    Additional Social History:                           Allergies:   Allergies  Allergen Reactions  . Peanuts [Peanut Oil] Anaphylaxis   Lab Results:  Results for orders placed or performed during the hospital encounter of 11/04/15 (from the past 48 hour(s))  Urine rapid drug screen (hosp performed)     Status: Abnormal   Collection Time: 11/05/15  1:06 AM  Result Value Ref Range   Opiates NONE DETECTED NONE DETECTED   Cocaine NONE DETECTED NONE DETECTED   Benzodiazepines NONE DETECTED NONE DETECTED   Amphetamines NONE DETECTED NONE DETECTED   Tetrahydrocannabinol POSITIVE (A) NONE DETECTED   Barbiturates NONE DETECTED NONE DETECTED    Comment:        DRUG SCREEN FOR MEDICAL PURPOSES ONLY.  IF CONFIRMATION IS NEEDED FOR ANY PURPOSE, NOTIFY LAB WITHIN 5 DAYS.        LOWEST DETECTABLE LIMITS FOR URINE DRUG SCREEN Drug Class       Cutoff (ng/mL) Amphetamine      1000 Barbiturate      200 Benzodiazepine   200 Tricyclics       300 Opiates          300 Cocaine          300 THC              50   CBC with Differential     Status: None   Collection Time: 11/05/15  1:38 AM  Result Value Ref Range   WBC 5.3 4.0 - 10.5 K/uL   RBC 5.09 4.22 - 5.81 MIL/uL   Hemoglobin 15.1 13.0 - 17.0 g/dL   HCT 16.1 09.6 - 04.5 %   MCV 82.7 78.0 - 100.0 fL   MCH 29.7 26.0 - 34.0 pg   MCHC 35.9 30.0 - 36.0 g/dL   RDW 40.9 81.1 - 91.4 %   Platelets 205 150 - 400 K/uL   Neutrophils Relative % 44 %   Neutro Abs 2.3 1.7 - 7.7 K/uL   Lymphocytes Relative 47 %   Lymphs Abs 2.5 0.7 - 4.0 K/uL   Monocytes Relative 5 %   Monocytes Absolute 0.3 0.1 - 1.0 K/uL   Eosinophils Relative 3 %   Eosinophils Absolute 0.1 0.0 - 0.7 K/uL   Basophils Relative 1 %   Basophils Absolute 0.0 0.0 - 0.1 K/uL  Ethanol     Status: None   Collection  Time: 11/05/15  1:38 AM  Result Value Ref Range   Alcohol, Ethyl (B) <5 <5 mg/dL    Comment:        LOWEST DETECTABLE LIMIT FOR SERUM ALCOHOL IS 5 mg/dL FOR MEDICAL PURPOSES ONLY   I-stat chem 8, ed     Status: Abnormal   Collection  Time: 11/05/15  1:52 AM  Result Value Ref Range   Sodium 139 135 - 145 mmol/L   Potassium 3.7 3.5 - 5.1 mmol/L   Chloride 98 (L) 101 - 111 mmol/L   BUN 7 6 - 20 mg/dL   Creatinine, Ser 4.09 0.61 - 1.24 mg/dL   Glucose, Bld 75 65 - 99 mg/dL   Calcium, Ion 8.11 9.14 - 1.40 mmol/L   TCO2 29 0 - 100 mmol/L   Hemoglobin 16.0 13.0 - 17.0 g/dL   HCT 78.2 95.6 - 21.3 %  TSH     Status: None   Collection Time: 11/05/15  9:44 AM  Result Value Ref Range   TSH 1.685 0.350 - 4.500 uIU/mL  Rapid HIV screen (HIV 1/2 Ab+Ag)     Status: None   Collection Time: 11/05/15  9:44 AM  Result Value Ref Range   HIV-1 P24 Antigen - HIV24 NON REACTIVE NON REACTIVE   HIV 1/2 Antibodies NON REACTIVE NON REACTIVE   Interpretation (HIV Ag Ab)      A non reactive test result means that HIV 1 or HIV 2 antibodies and HIV 1 p24 antigen were not detected in the specimen.    Comment: RESULT CALLED TO, READ BACK BY AND VERIFIED WITH: BATCHELOR,D. RN AT 1054 11/05/15 MULLINS,T   Hepatic function panel     Status: Abnormal   Collection Time: 11/05/15  9:44 AM  Result Value Ref Range   Total Protein 8.8 (H) 6.5 - 8.1 g/dL   Albumin 5.1 (H) 3.5 - 5.0 g/dL   AST 26 15 - 41 U/L   ALT 23 17 - 63 U/L   Alkaline Phosphatase 49 38 - 126 U/L   Total Bilirubin 1.2 0.3 - 1.2 mg/dL   Bilirubin, Direct <0.8 (L) 0.1 - 0.5 mg/dL   Indirect Bilirubin NOT CALCULATED 0.3 - 0.9 mg/dL    Blood Alcohol level:  Lab Results  Component Value Date   ETH <5 11/05/2015    Metabolic Disorder Labs:  No results found for: HGBA1C, MPG No results found for: PROLACTIN No results found for: CHOL, TRIG, HDL, CHOLHDL, VLDL, LDLCALC  Current Medications: Current Facility-Administered Medications   Medication Dose Route Frequency Provider Last Rate Last Dose  . acetaminophen (TYLENOL) tablet 650 mg  650 mg Oral Q6H PRN Charm Rings, NP      . alum & mag hydroxide-simeth (MAALOX/MYLANTA) 200-200-20 MG/5ML suspension 30 mL  30 mL Oral Q4H PRN Charm Rings, NP      . hydrOXYzine (ATARAX/VISTARIL) tablet 25 mg  25 mg Oral TID PRN Charm Rings, NP      . magnesium hydroxide (MILK OF MAGNESIA) suspension 30 mL  30 mL Oral Daily PRN Charm Rings, NP      . mirtazapine (REMERON) tablet 15 mg  15 mg Oral QHS Charm Rings, NP      . ondansetron [facility]) tablet 4 mg  4 mg Oral Q8H PRN Charm Rings, NP      . thiamine (B-1) injection 100 mg  100 mg Intramuscular Daily Acquanetta Sit, MD      . Melene Muller ON 11/07/2015] thiamine (VITAMIN B-1) tablet 100 mg  100 mg Oral Daily Acquanetta Sit, MD       PTA Medications: Prescriptions Prior to Admission  Medication Sig Dispense Refill Last Dose  . doxycycline (VIBRA-TABS) 100 MG tablet Take 1 tablet (100 mg total) by mouth 2 (two) times daily. (Patient not taking: Reported on 11/04/2015)  28 tablet 0 Not Taking at Unknown time  . Multiple Vitamin (MULTIVITAMIN WITH MINERALS) TABS tablet Take 2 tablets by mouth at bedtime.       Musculoskeletal: Strength & Muscle Tone: within normal limits Gait & Station: normal Patient leans: N/A  Psychiatric Specialty Exam: Physical Exam  Constitutional: He appears cachectic.    Review of Systems  All other systems reviewed and are negative.   Blood pressure 110/87, pulse 83, temperature 98.1 F (36.7 C), temperature source Oral, resp. rate 18, height 5' 10.5" (1.791 m), weight 46.7 kg (103 lb), SpO2 100 %.Body mass index is 14.57 kg/m.  General Appearance: Casual  Eye Contact:  Minimal  Speech:  Clear and Coherent  Volume:  Normal  Mood:  Euthymic  Affect:  Congruent  Thought Process:  Coherent  Orientation:  Full (Time, Place, and Person)  Thought Content:  Negative  Suicidal  Thoughts:  No  Homicidal Thoughts:  No  Memory:  Negative  Judgement:  Impaired  Insight:  Fair and Shallow  Psychomotor Activity:  Normal  Concentration:  Concentration: Good and Attention Span: Good  Recall:  Good  Fund of Knowledge:  Good  Language:  Good  Akathisia:  No  Handed:  Right  AIMS (if indicated):     Assets:  Communication Skills Desire for Improvement Physical Health  ADL's:  Intact  Cognition:  WNL  Sleep:       Treatment Plan Summary: Daily contact with patient to assess and evaluate symptoms and progress in treatment, Medication management and We will discontinue Prozac and continue Remeron as we should begin with a single agent and this one is possibly more of an aid in stimulating appetite. If Mr. Delford FieldWright does have a significant long-term eating disorder he will need referral to specialized programs for treatment. Will Check phosphorus tomorrow to monitor for refeeding syndrome, check EKG today for baseline, check B12 order thiamine injections and po to guard against Wernicke's encephalopathy Observation Level/Precautions:  15 minute checks  Laboratory:  check ekg, phosphorus  Psychotherapy:  1:1 milieu group  Medications:  See MAR  Consultations:    Discharge Concerns:    Estimated LOS: 2-5 days  Other:     Physician Treatment Plan for Primary Diagnosis: Anorexia nervosa Long Term Goal(s): Improvement in symptoms so as ready for discharge  Short Term Goals: Ability to identify changes in lifestyle to reduce recurrence of condition will improve and Ability to maintain clinical measurements within normal limits will improve  Physician Treatment Plan for Secondary Diagnosis: Principal Problem:   Anorexia nervosa Active Problems:   Major depressive disorder, single episode, severe without psychotic features (HCC)  Long Term Goal(s): Improvement in symptoms so as ready for discharge  Short Term Goals: Ability to identify changes in lifestyle to reduce  recurrence of condition will improve, Ability to verbalize feelings will improve, Ability to identify and develop effective coping behaviors will improve and Ability to maintain clinical measurements within normal limits will improve  I certify that inpatient services furnished can reasonably be expected to improve the patient's condition.    Acquanetta SitElizabeth Woods Oates, MD 10/2/20173:46 PM

## 2015-11-05 NOTE — Progress Notes (Signed)
[name] is a 25 year old male being admitted involuntarily to [facility] from [facility].   He was brought in under IVC initiated by his mother due to being withdrawn, sleeping in the floor, no communication with others, shows no emotions and has lost 25 pounds in the past 4-5 months.  It was reported that his mother found a poem that he had written about thoughts of suicide and not recognizing himself.  He denies SI/HI or A/V hallucinations.  He had poor eye contact but denies any symptoms.  "I just want to get back with my life and my classes."  He reported going to classes through a welfare program and is looking forward to moving on with his life.  He denies any psychiatric history and takes no medications.  He denies any medical issues and appears to be in no physical distress.  Eye contact poor.  He admitted to smoking pot 1-2 times per week but no other substance abuse noted.  Oriented him to the unit.  Admission paperwork completed and signed.  Belongings searched and secured in locker # 34.  Skin assessment completed and no skin issues noted.  Q 15 minute checks initiated for safety.  We will monitor the progress towards his goals.

## 2015-11-05 NOTE — BH Assessment (Signed)
[facility] Assessment Progress Note  Per Thedore MinsMojeed Akintayo, MD, this pt requires psychiatric hospitalization.  Berneice Heinrichina Tate, RN, [facility].  Pt presents under IVC initiated by his Mother, Erroll LunaZarassa Ahonen, and upheld by Dr Jannifer FranklinAkintayo, and [facility].  Pt's nurse, Diane, has been notified, and agrees to call report to [phone].  Pt is to be transported via [employer].   Doylene Canninghomas Johnedward Brodrick, MA Triage Specialist [phone]

## 2015-11-05 NOTE — ED Notes (Signed)
Pt ate the baked potato and green beans. He said that he is not vegetarian, he just did not like the meat.

## 2015-11-05 NOTE — ED Notes (Signed)
Pt's affect is flat and he was tearful after a male visitor left. He has spent a lot of time on the telephone. Pt is cooperative. His thoughts seem to be possibly disorganized because it is difficult to understand what he is saying. He did not eat breakfast and told staff, "You can just take that away."

## 2015-11-05 NOTE — Tx Team (Signed)
Initial Treatment Plan 11/05/2015 2:55 PM Stanley SpryQunzae Chenault ZOX:[id number]    PATIENT STRESSORS: Financial difficulties Marital or family conflict Occupational concerns   PATIENT STRENGTHS: Wellsite geologistCommunication skills General fund of knowledge   PATIENT IDENTIFIED PROBLEMS: Depression  Anxiety   Suicidal ideation  "Satisfy mother and my family"  "Released back in society to complete the work program I'm in"             DISCHARGE CRITERIA:  Improved stabilization in mood, thinking, and/or behavior Verbal commitment to aftercare and medication compliance  PRELIMINARY DISCHARGE PLAN: Outpatient therapy Medication management  PATIENT/FAMILY INVOLVEMENT: This treatment plan has been presented to and reviewed with the patient, Stanley Moon.  The patient and family have been given the opportunity to ask questions and make suggestions.  Levin BaconHeather V Thanvi Blincoe, RN 11/05/2015, 2:55 PM

## 2015-11-05 NOTE — Progress Notes (Signed)
Psychoeducational Group Note  Date:  11/05/2015 Time:  2225  Group Topic/Focus:  Wrap-Up Group:   The focus of this group is to help patients review their daily goal of treatment and discuss progress on daily workbooks.   Participation Level: Did Not Attend  Participation Quality:  Not Applicable  Affect:  Not Applicable  Cognitive:  Not Applicable  Insight:  Not Applicable  Engagement in Group: Not Applicable  Additional Comments: The patient did not attend this evening's A.A. Meeting since he was asleep in his bed.    Hazle CocaGOODMAN, Nicolette Gieske S 11/05/2015, 10:25 PM

## 2015-11-05 NOTE — ED Notes (Signed)
Pt transported to [facility] by [employer]. All belongings returned to pt who signed for same.  

## 2015-11-05 NOTE — ED Notes (Signed)
Blood drawn for labs; pt tolerated well.

## 2015-11-05 NOTE — Consult Note (Signed)
[facility]   Reason for Consult:  Depression, recurrent suicidal thoughts Referring Physician:  EDP Patient Identification: Stanley Moon MRN:  [id number] Principal Diagnosis: Major depressive disorder, single episode, severe (HCC) Diagnosis:   Patient Active Problem List   Diagnosis Date Noted  . Major depressive disorder, single episode, severe (HCC) [F32.2] 11/05/2015    Priority: High    Total Time spent with patient: 45 minutes  Subjective:   Stanley Moon is a 25 y.o. male patient admitted with severe depression.  HPI:  Stanley Moon is a 25 y.o. male who denies previous history of mental illness but reports occasional use of Cannabis. Patient was IVC'ed by his mother who is concerned about his current state of mind. Patient is evasive regarding his present circumstance, only admits to severe depression. He gave permission to interview his mother who reports that his son became depressed since he lost his job in November last year. Patient has progressive become more depressed, hopeless, helpless and suicidal. She found a suicide note written by the patient where he stated that "life is not worth living". Patient has become withdrawn, isolated, not sleeping, not eating and has lost about 35Ibs in the last 5 months. Mother reports that patient only eats once daily in the last few months.     Past Psychiatric History: none per patient  Risk to Self: Suicidal Ideation: No (Mother says Pt has expressed SI, Pt denies) Suicidal Intent: No Is patient at risk for suicide?: No Suicidal Plan?: No Access to Means: No What has been your use of drugs/alcohol within the last 12 months?: Mother reports Pt is using marijuana, Pt denies How many times?: 0 Other Self Harm Risks: Mother reports Pt has lost a significant amount of weight Triggers for Past Attempts: None known Intentional Self Injurious Behavior: None Risk to Others: Homicidal Ideation: No Thoughts of Harm  to Others: No Current Homicidal Intent: No Current Homicidal Plan: No Access to Homicidal Means: No Identified Victim: None History of harm to others?: No Assessment of Violence: None Noted Violent Behavior Description: Pt denies history of violence or aggression Does patient have access to weapons?: No Criminal Charges Pending?: No Does patient have a court date: No Prior Inpatient Therapy: Prior Inpatient Therapy: No Prior Therapy Dates: NA Prior Therapy Facilty/Provider(s): NA Reason for Treatment: NA Prior Outpatient Therapy: Prior Outpatient Therapy: No Prior Therapy Dates: NA Prior Therapy Facilty/Provider(s): NA Reason for Treatment: NA Does patient have an ACCT team?: No Does patient have Intensive In-House Services?  : No Does patient have [facility] services? : No Does patient have P4CC services?: No  Past Medical History:  Past Medical History:  Diagnosis Date  . Asthma    History reviewed. No pertinent surgical history. Family History: No family history on file. Family Psychiatric  History:  Social History:  History  Alcohol Use No     History  Drug Use No    Social History   Social History  . Marital status: Single    Spouse name: N/A  . Number of children: N/A  . Years of education: N/A   Social History Main Topics  . Smoking status: Never Smoker  . Smokeless tobacco: Never Used  . Alcohol use No  . Drug use: No  . Sexual activity: Not Asked   Other Topics Concern  . None   Social History Narrative  . None   Additional Social History:    Allergies:   Allergies  Allergen Reactions  . Peanuts [Peanut  Oil] Anaphylaxis    Labs:  Results for orders placed or performed during the hospital encounter of 11/04/15 (from the past 48 hour(s))  Urine rapid drug screen (hosp performed)     Status: Abnormal   Collection Time: 11/05/15  1:06 AM  Result Value Ref Range   Opiates NONE DETECTED NONE DETECTED   Cocaine NONE DETECTED NONE DETECTED    Benzodiazepines NONE DETECTED NONE DETECTED   Amphetamines NONE DETECTED NONE DETECTED   Tetrahydrocannabinol POSITIVE (A) NONE DETECTED   Barbiturates NONE DETECTED NONE DETECTED    Comment:        DRUG SCREEN FOR MEDICAL PURPOSES ONLY.  IF CONFIRMATION IS NEEDED FOR ANY PURPOSE, NOTIFY LAB WITHIN 5 DAYS.        LOWEST DETECTABLE LIMITS FOR URINE DRUG SCREEN Drug Class       Cutoff (ng/mL) Amphetamine      1000 Barbiturate      200 Benzodiazepine   200 Tricyclics       300 Opiates          300 Cocaine          300 THC              50   CBC with Differential     Status: None   Collection Time: 11/05/15  1:38 AM  Result Value Ref Range   WBC 5.3 4.0 - 10.5 K/uL   RBC 5.09 4.22 - 5.81 MIL/uL   Hemoglobin 15.1 13.0 - 17.0 g/dL   HCT 47.8 29.5 - 62.1 %   MCV 82.7 78.0 - 100.0 fL   MCH 29.7 26.0 - 34.0 pg   MCHC 35.9 30.0 - 36.0 g/dL   RDW 30.8 65.7 - 84.6 %   Platelets 205 150 - 400 K/uL   Neutrophils Relative % 44 %   Neutro Abs 2.3 1.7 - 7.7 K/uL   Lymphocytes Relative 47 %   Lymphs Abs 2.5 0.7 - 4.0 K/uL   Monocytes Relative 5 %   Monocytes Absolute 0.3 0.1 - 1.0 K/uL   Eosinophils Relative 3 %   Eosinophils Absolute 0.1 0.0 - 0.7 K/uL   Basophils Relative 1 %   Basophils Absolute 0.0 0.0 - 0.1 K/uL  Ethanol     Status: None   Collection Time: 11/05/15  1:38 AM  Result Value Ref Range   Alcohol, Ethyl (B) <5 <5 mg/dL    Comment:        LOWEST DETECTABLE LIMIT FOR SERUM ALCOHOL IS 5 mg/dL FOR MEDICAL PURPOSES ONLY   I-stat chem 8, ed     Status: Abnormal   Collection Time: 11/05/15  1:52 AM  Result Value Ref Range   Sodium 139 135 - 145 mmol/L   Potassium 3.7 3.5 - 5.1 mmol/L   Chloride 98 (L) 101 - 111 mmol/L   BUN 7 6 - 20 mg/dL   Creatinine, Ser 9.62 0.61 - 1.24 mg/dL   Glucose, Bld 75 65 - 99 mg/dL   Calcium, Ion 9.52 8.41 - 1.40 mmol/L   TCO2 29 0 - 100 mmol/L   Hemoglobin 16.0 13.0 - 17.0 g/dL   HCT 32.4 40.1 - 02.7 %    Current  Facility-Administered Medications  Medication Dose Route Frequency Provider Last Rate Last Dose  . FLUoxetine (PROZAC) capsule 10 mg  10 mg Oral Daily Serai Tukes, MD      . hydrOXYzine (ATARAX/VISTARIL) tablet 25 mg  25 mg Oral TID PRN Thedore Mins, MD      .  mirtazapine (REMERON) tablet 15 mg  15 mg Oral QHS Rylan Kaufmann, MD      . ondansetron (ZOFRAN) tablet 4 mg  4 mg Oral Q8H PRN Earley Favor, NP       Current Outpatient Prescriptions  Medication Sig Dispense Refill  . Multiple Vitamin (MULTIVITAMIN WITH MINERALS) TABS tablet Take 2 tablets by mouth at bedtime.    Marland Kitchen doxycycline (VIBRA-TABS) 100 MG tablet Take 1 tablet (100 mg total) by mouth 2 (two) times daily. (Patient not taking: Reported on 11/04/2015) 28 tablet 0    Musculoskeletal: Strength & Muscle Tone: decreased Gait & Station: normal Patient leans: N/A  Psychiatric Specialty Exam: Physical Exam  Psychiatric: His speech is normal. Judgment normal. His affect is blunt. He is slowed and withdrawn. Cognition and memory are normal. He exhibits a depressed mood. He expresses suicidal ideation.    Review of Systems  Constitutional: Positive for malaise/fatigue and weight loss.  HENT: Negative.   Eyes: Negative.   Respiratory: Negative.   Cardiovascular: Negative.   Gastrointestinal: Negative.   Genitourinary: Negative.   Musculoskeletal: Negative.   Skin: Negative.   Neurological: Positive for weakness.  Endo/Heme/Allergies: Negative.   Psychiatric/Behavioral: Positive for depression, substance abuse and suicidal ideas. The patient has insomnia.     Blood pressure 101/76, pulse 66, temperature 98 F (36.7 C), temperature source Oral, resp. rate 17, height 5\' 10"  (1.778 m), SpO2 95 %.There is no height or weight on file to calculate BMI.  General Appearance: Casual  Eye Contact:  Minimal  Speech:  Clear and Coherent  Volume:  Decreased  Mood:  Depressed and Hopeless  Affect:  Constricted  Thought Process:   Coherent and Descriptions of Associations: Loose  Orientation:  Full (Time, Place, and Person)  Thought Content:  Logical  Suicidal Thoughts:  Yes.  without intent/plan  Homicidal Thoughts:  No  Memory:  Immediate;   Fair Recent;   Good Remote;   Good  Judgement:  Poor  Insight:  Lacking  Psychomotor Activity:  Decreased and Psychomotor Retardation  Concentration:  Concentration: Fair and Attention Span: Fair  Recall:  Good  Fund of Knowledge:  Good  Language:  Good  Akathisia:  No  Handed:  Right  AIMS (if indicated):     Assets:  Communication Skills  ADL's:  Intact  Cognition:  WNL  Sleep:   poor     Treatment Plan Summary: Crisis stabilization Daily contact with patient to assess and evaluate symptoms and progress in treatment, Medication management Start Remeron 15 mg qhs for insomnia/poor appetite/depression Start Prozac 10 mg po daily for depression.   Disposition: Recommend psychiatric Inpatient admission when medically cleared. Supportive therapy provided about ongoing stressors. inpatient admission for stabilization  Thedore Mins, MD 11/05/2015 9:55 AM

## 2015-11-05 NOTE — ED Notes (Signed)
Patient and belongings wanded, report given to West SalemJudy, Charity fundraiserN.

## 2015-11-06 LAB — T3: T3, Total: 102 ng/dL (ref 71–180)

## 2015-11-06 LAB — T4: T4 TOTAL: 9 ug/dL (ref 4.5–12.0)

## 2015-11-06 LAB — RPR: RPR: NONREACTIVE

## 2015-11-06 LAB — PHOSPHORUS: PHOSPHORUS: 4.4 mg/dL (ref 2.5–4.6)

## 2015-11-06 LAB — VITAMIN B12: Vitamin B-12: 544 pg/mL (ref 180–914)

## 2015-11-06 MED ORDER — ENSURE ENLIVE PO LIQD: 237.0000 mL | Freq: Two times a day (BID) | ORAL | Status: DC | PRN

## 2015-11-06 MED ORDER — ENSURE ENLIVE PO LIQD: 237.0000 mL | Freq: Three times a day (TID) | ORAL | Status: DC

## 2015-11-06 NOTE — Progress Notes (Signed)
[facility] MD Progress Note  11/06/2015 1:35 PM  Patient Active Problem List   Diagnosis Date Noted  . Major depressive disorder, single episode, severe (HCC) 11/05/2015  . Major depressive disorder, single episode, severe without psychotic features (HCC) 11/05/2015  . Anorexia nervosa 11/05/2015    Diagnosis:Anorexia nervosa, restricting type  Subjective: Patient denies any suicidal or homicidal ideation, plan or intent at present. He states that by his self report he did eat some of the supper last night. He states he has spent his time getting used to the facility and to his roommate. He has also been in contact with several family members to let them know where he is and discuss future plans.  Objective: Well-developed cachectic male in no apparent distress pleasant and cooperative speech and motor appear within normal limits thought processes are linear and goal directed thought content he denies any suicidal or homicidal ideation, plan or intent he denies any psychotic symptoms, alert and oriented 3, insight into eating disorder is somewhat limited judgment is also somewhat limited in this area, IQ appears within average range         Current Facility-Administered Medications (Analgesics):  .  acetaminophen (TYLENOL) tablet 650 mg     Current Facility-Administered Medications (Other):  .  alum & mag hydroxide-simeth (MAALOX/MYLANTA) 200-200-20 MG/5ML suspension 30 mL .  feeding supplement (ENSURE ENLIVE) (ENSURE ENLIVE) liquid 237 mL .  hydrOXYzine (ATARAX/VISTARIL) tablet 25 mg .  magnesium hydroxide (MILK OF MAGNESIA) suspension 30 mL .  mirtazapine (REMERON) tablet 15 mg .  ondansetron (ZOFRAN) tablet 4 mg .  [START ON 11/07/2015] thiamine (VITAMIN B-1) tablet 100 mg  No current outpatient prescriptions on file.  Vital Signs:Blood pressure 110/87, pulse 83, temperature 98.1 F (36.7 C), temperature source Oral, resp. rate 18, height 5' 10.5" (1.791 m), weight 46.7 kg (103  lb), SpO2 100 %.    Lab Results:  Results for orders placed or performed during the hospital encounter of 11/05/15 (from the past 48 hour(s))  Phosphorus     Status: None   Collection Time: 11/06/15  6:23 AM  Result Value Ref Range   Phosphorus 4.4 2.5 - 4.6 mg/dL    Comment: Performed at [facility]  Vitamin B12     Status: None   Collection Time: 11/06/15  6:23 AM  Result Value Ref Range   Vitamin B-12 544 180 - 914 pg/mL    Comment: (NOTE) This assay is not validated for testing neonatal or myeloproliferative syndrome specimens for Vitamin B12 levels. Performed at [facility]     Physical Findings: AIMS: Facial and Oral Movements Muscles of Facial Expression: None, normal Lips and Perioral Area: None, normal Jaw: None, normal Tongue: None, normal,Extremity Movements Upper (arms, wrists, hands, fingers): None, normal Lower (legs, knees, ankles, toes): None, normal, Trunk Movements Neck, shoulders, hips: None, normal, Overall Severity Severity of abnormal movements (highest score from questions above): None, normal Incapacitation due to abnormal movements: None, normal Patient's awareness of abnormal movements (rate only patient's report): No Awareness, Dental Status Current problems with teeth and/or dentures?: No Does patient usually wear dentures?: No  CIWA:    COWS:      Assessment/Plan: Mr. Heggie phosphorus was good at 4.4 and his EKG showed no acute issues. I will change any Ensure ordered to when necessary if patient does not complete meals as we do not wish him to have refeeding syndrome however given his limited intake prior to admission. We will continue to monitor  labs and heart issues as needed. Mr. I could probably benefit from referral to a specialized eating disorder treatment program as we are not able to offer that level of support and monitoring here. Social work is aware and is working on resources.  Acquanetta SitElizabeth Woods Bastion Bolger,  MD 11/06/2015, 1:35 PM

## 2015-11-06 NOTE — Progress Notes (Signed)
Patient ID: Stanley Moon, male   DOB: 12/19/1990, 25 y.o.   MRN: [id number]  D: Patient in bed resting on approach. Pt reports "been fatigue from medications given earlier in the day". Pt denies SI/HI/AVH and pain. Pt mood and affect appeared depressed and flat. Cooperative with assessment. No acute distressed noted at this time.   A: Ensure drink offered to pt. Support and encouragement provided to attend groups and engage in milieu. Pt encouraged to discuss feelings and come to staff with any question or concerns.   R: Patient remains safe.

## 2015-11-06 NOTE — Progress Notes (Signed)
NUTRITION ASSESSMENT  Pt identified as at risk on the Malnutrition Screen Tool  INTERVENTION: 1. Recommend referral to outpatient dietitian (i.e. [facility] or [facility]) 2. Supplements: Ensure Enlive po TID, each supplement provides 350 kcal and 20 grams of protein  NUTRITION DIAGNOSIS: Unintentional weight loss related to sub-optimal intake as evidenced by pt report.   Goal: Pt to meet >/= 90% of their estimated nutrition needs.  Monitor:  PO intake  Assessment:  Pt admitted with depression. Pt with disordered eating patterns such as skipping most meals and high activity levels. Pt does not deny eating disorder patterns. Pt very aware of what he eats. Ensure supplements have been ordered, TID. Patient would benefit from seeing an outpatient dietitian after discharge. Recommendation above.    Height: Ht Readings from Last 1 Encounters:  11/05/15 5' 10.5" (1.791 m)    Weight: Wt Readings from Last 1 Encounters:  11/05/15 103 lb (46.7 kg)    Weight Hx: Wt Readings from Last 10 Encounters:  11/05/15 103 lb (46.7 kg)  08/29/13 135 lb (61.2 kg)    BMI:  Body mass index is 14.57 kg/m. Pt meets criteria for underweight based on current BMI.  Estimated Nutritional Needs: Kcal: 25-30 kcal/kg Protein: > 1 gram protein/kg Fluid: 1 ml/kcal  Diet Order: Diet Heart Room service appropriate? Yes; Fluid consistency: Thin Pt is also offered choice of unit snacks mid-morning and mid-afternoon.  Pt is eating as desired.   Lab results and medications reviewed.   Tilda FrancoLindsey Jaymin Waln, MS, RD, LDN Pager: [phone] After Hours Pager: [phone]

## 2015-11-06 NOTE — Plan of Care (Signed)
Problem: Health Behavior/Discharge Planning: Goal: Compliance with therapeutic regimen will improve Outcome: Progressing Compliant with medication regiment  Problem: Safety: Goal: Ability to disclose and discuss suicidal ideas will improve Outcome: Progressing Denies suicidal ideation

## 2015-11-06 NOTE — Tx Team (Signed)
Interdisciplinary Treatment and Diagnostic Plan Update  11/06/2015 Time of Session: 12:14 PM  Stanley Moon MRN: [id number]  Principal Diagnosis: Anorexia nervosa  Secondary Diagnoses: Principal Problem:   Anorexia nervosa Active Problems:   Major depressive disorder, single episode, severe without psychotic features (HCC)   Current Medications:  Current Facility-Administered Medications  Medication Dose Route Frequency Provider Last Rate Last Dose  . acetaminophen (TYLENOL) tablet 650 mg  650 mg Oral Q6H PRN Charm Rings, NP      . alum & mag hydroxide-simeth (MAALOX/MYLANTA) 200-200-20 MG/5ML suspension 30 mL  30 mL Oral Q4H PRN Charm Rings, NP      . feeding supplement (ENSURE ENLIVE) (ENSURE ENLIVE) liquid 237 mL  237 mL Oral BID BM PRN Acquanetta Sit, MD      . hydrOXYzine (ATARAX/VISTARIL) tablet 25 mg  25 mg Oral TID PRN Charm Rings, NP      . magnesium hydroxide (MILK OF MAGNESIA) suspension 30 mL  30 mL Oral Daily PRN Charm Rings, NP      . mirtazapine (REMERON) tablet 15 mg  15 mg Oral QHS Charm Rings, NP      . ondansetron [facility]) tablet 4 mg  4 mg Oral Q8H PRN Charm Rings, NP      . Melene Muller ON 11/07/2015] thiamine (VITAMIN B-1) tablet 100 mg  100 mg Oral Daily Acquanetta Sit, MD        PTA Medications: Prescriptions Prior to Admission  Medication Sig Dispense Refill Last Dose  . doxycycline (VIBRA-TABS) 100 MG tablet Take 1 tablet (100 mg total) by mouth 2 (two) times daily. (Patient not taking: Reported on 11/04/2015) 28 tablet 0 Not Taking at Unknown time  . Multiple Vitamin (MULTIVITAMIN WITH MINERALS) TABS tablet Take 2 tablets by mouth at bedtime.       Treatment Modalities: Medication Management, Group therapy, Case management,  1 to 1 session with clinician, Psychoeducation, Recreational therapy.   Physician Treatment Plan for Primary Diagnosis: Anorexia nervosa Long Term Goal(s): Improvement in symptoms so as ready for  discharge  Short Term Goals: Ability to identify changes in lifestyle to reduce recurrence of condition will improve and Ability to maintain clinical measurements within normal limits will improve  Medication Management: Evaluate patient's response, side effects, and tolerance of medication regimen.  Therapeutic Interventions: 1 to 1 sessions, Unit Group sessions and Medication administration.  Evaluation of Outcomes: Progressing  Physician Treatment Plan for Secondary Diagnosis: Principal Problem:   Anorexia nervosa Active Problems:   Major depressive disorder, single episode, severe without psychotic features (HCC)   Long Term Goal(s): Improvement in symptoms so as ready for discharge  Short Term Goals: Ability to identify changes in lifestyle to reduce recurrence of condition will improve, Ability to verbalize feelings will improve and Ability to identify and develop effective coping behaviors will improve  Medication Management: Evaluate patient's response, side effects, and tolerance of medication regimen.  Therapeutic Interventions: 1 to 1 sessions, Unit Group sessions and Medication administration.  Evaluation of Outcomes: Progressing   RN Treatment Plan for Primary Diagnosis: Anorexia nervosa Long Term Goal(s): Knowledge of disease and therapeutic regimen to maintain health will improve  Short Term Goals: Ability to verbalize feelings will improve and Ability to identify and develop effective coping behaviors will improve  Medication Management: RN will administer medications as ordered by provider, will assess and evaluate patient's response and provide education to patient for prescribed medication. RN will report any adverse and/or side effects  to prescribing provider.  Therapeutic Interventions: 1 on 1 counseling sessions, Psychoeducation, Medication administration, Evaluate responses to treatment, Monitor vital signs and CBGs as ordered, Perform/monitor CIWA, COWS, AIMS  and Fall Risk screenings as ordered, Perform wound care treatments as ordered.  Evaluation of Outcomes: Progressing   LCSW Treatment Plan for Primary Diagnosis: Anorexia nervosa Long Term Goal(s): Safe transition to appropriate next level of care at discharge, Engage patient in therapeutic group addressing interpersonal concerns.  Short Term Goals: Engage patient in aftercare planning with referrals and resources  Therapeutic Interventions: Assess for all discharge needs, 1 to 1 time with Social worker, Explore available resources and support systems, Assess for adequacy in community support network, Educate family and significant other(s) on suicide prevention, Complete Psychosocial Assessment, Interpersonal group therapy.  Evaluation of Outcomes: Progressing   Progress in Treatment: Attending groups: Yes Participating in groups: Yes Taking medication as prescribed: Yes, MD continues to assess for medication changes as needed Toleration medication: Yes, no side effects reported at this time Family/Significant other contact made:  Patient understands diagnosis:  Discussing patient identified problems/goals with staff: Yes Medical problems stabilized or resolved: Yes Denies suicidal/homicidal ideation:  Issues/concerns per patient self-inventory: None Other: N/A  New problem(s) identified: None identified at this time.   New Short Term/Long Term Goal(s): None identified at this time.   Discharge Plan or Barriers:  Patient at this time only wants a referral for PCP/primary care.  He is not interested in medication or wants a referral for therapy.  Reason for Continuation of Hospitalization: Anxiety Delusions  Depression Hallucinations Homicidal ideation [place] Medical Issues Medication stabilization Suicidal ideation Withdrawal symptoms  Estimated Length of Stay: 3-5 days  Attendees: Patient:  Stanley Moon 11/06/2015  12:14 PM  Physician: Dr. Mckinley Jewelates 11/06/2015  12:14 PM   Nursing:  11/06/2015  12:14 PM  RN Care Manager: Victorino DikeJennifer 11/06/2015  12:14 PM  Social Worker: Mordecai RasmussenHannah Senon Nixon 11/06/2015  12:14 PM  Recreational Therapist:  11/06/2015  12:14 PM  Other:  11/06/2015  12:14 PM  Other:  11/06/2015  12:14 PM  Other: 11/06/2015  12:14 PM    Scribe for Treatment Team:  Raye SorrowHannah N Davina Howlett, LCSW

## 2015-11-06 NOTE — [facility] Group Notes (Signed)
[facility] LCSW Group Therapy 11/06/2015  1:15 PM   Type of Therapy: Group Therapy  Participation Level: Did Not Attend. Patient invited to participate but declined.   Dezmond Downie, MSW, LCSW Clinical Social Worker [facility] [phone]   

## 2015-11-06 NOTE — BHH Counselor (Signed)
Adult Comprehensive Assessment  Patient ID: Stanley Moon, male   DOB: 08/09/1990, 25 y.o.   MRN: [id number]  Information Source: Information source: Patient  Current Stressors:  Employment / Job issues: Patient lost his job 16 months ago from [employer], since that time has had unstable income and housing Surveyor, quantityinancial / Lack of resources (include bankruptcy): lack of income, thus lack of food and shelter, has been living on the streets until 3 weeks ago. Housing / Lack of housing: homeless for over 6 months Physical health (include injuries & life threatening diseases): malnutrition, excessive weight loss in the last 4 months  Living/Environment/Situation:  Living Arrangements: Parent Living conditions (as described by patient or guardian): Patient recently relocated into family home with his mother 3 weeks ago.  Reports supportive and engaged with his mother and can return.   How long has patient lived in current situation?: last 3 weeks What is atmosphere in current home: Supportive, Loving  Family History:  Marital status: Single Are you sexually active?: No What is your sexual orientation?: heterosexual Has your sexual activity been affected by drugs, alcohol, medication, or emotional stress?: NA Does patient have children?: No  Childhood History:  By whom was/is the patient raised?: Both parents Additional childhood history information: Patient was removed from his mother at the age of 25 years old, placed into custody with his father and moved to [place] and remained with father until the age of 25 years old.   From the age of newborn-25 years old, patient moved frequently, and decribed his childhood as "a nomad".  Patient moved through several school and apartment complexes.     Description of patient's relationship with caregiver when they were a child: Patient has positive relationship with both parents.  Live primarly with father, but saw mother on holidays and summer  vacations Patient's description of current relationship with people who raised him/her: Patient remains involved with both parents and lives with his mother How were you disciplined when you got in trouble as a child/adolescent?: Typical discipline as a child, would lose privledges when he did not follow directions. Does patient have siblings?: Yes Number of Siblings: 1 Description of patient's current relationship with siblings: 23 year old brother whom patient takes up for and helps take care of with mother.  Has other siblings whom he does not see that live in [place] Did patient suffer any verbal/emotional/physical/sexual abuse as a child?: No Did patient suffer from severe childhood neglect?: No Has patient ever been sexually abused/assaulted/raped as an adolescent or adult?: No Was the patient ever a victim of a crime or a disaster?: No Witnessed domestic violence?: No Has patient been effected by domestic violence as an adult?: No  Education:  Highest grade of school patient has completed: 12 Currently a student?: No Name of school: NA Learning disability?: No  Employment/Work Situation:   Employment situation: Unemployed Patient's job has been impacted by current illness: No What is the longest time patient has a held a job?: Patient worked at [employer],  2 years Where was the patient employed at that time?: [employer] [place] Has patient ever been in the [employer] and Companymilitary?: No Has patient ever served in combat?: No Did You Receive Any Psychiatric Treatment/Services While in [employer] traderthe Military?: No Are There Guns or Other Weapons in Your Home?: No Are These Weapons Safely Secured?: Yes  Financial Resources:   Financial resources: Support from parents / caregiver, Food stamps Does patient have a representative payee or guardian?: No  Alcohol/Substance Abuse:  What has been your use of drugs/alcohol within the last 12 months?: Patient denies any use of substances besides THC in which she  smokes infrequently. If attempted suicide, did drugs/alcohol play a role in this?: No Alcohol/Substance Abuse Treatment Hx: Denies past history Has alcohol/substance abuse ever caused legal problems?: No  Social Support System:   Patient's Community Support System: Good Describe Community Support System: Strong support from community and family per report.  patient is involved in [place] program [place] and also completed the [employer] program in effort to finding a job. Type of faith/religion: denies  Leisure/Recreation:   Leisure and Hobbies: Patient reports he has always enjoyed music and his family would listen to music from sun up to sun down.  Reports he also writes poetry from time to time  Strengths/Needs:   What things does the patient do well?: Patient is able to navigate what he needs and the system.  he is able to survive. In what areas does patient struggle / problems for patient: Reports he struggles with stability and making eye contact  Discharge Plan:   Does patient have access to transportation?: Yes Will patient be returning to same living situation after discharge?: Yes Currently receiving community mental health services: No If no, would patient like referral for services when discharged?: No Does patient have financial barriers related to discharge medications?: No  Summary/Recommendations:   Summary and Recommendations (to be completed by the evaluator): [name] is an 25 y.o. single male who presents to [facility] ED after being petitioned for involuntary commitment by his mother, Charlton Boule [phone]. Affidavit and Petition states: "25 YO male with no history of mental illness. Admits to smoking marijuana. Petitioner states respondent is withdrawn, sleeps on the floor, does not communicate with other, shows no emotions and has lost 35 pound in the past 4-5 months. Today, Petitioner found a poem/letter that respondent wrote sating, "Majority   Of the time I want to commit suicide, Look myself in eyes and don't recognize what's inside. So much anger and angst. Hurting and destroying those I love the most. Life isn't worth. Nobody deserves to receive my hurt. In depression deeper than a river. My thoughts more grim than the reaper." Respondent is a danger to self."   Patient was active and engaged in assessment. He made no eye contact with this Clinical research associate at all. He reports it is easier to talk if he is not making eye contact.  He endorses some anxiety, but feels he does not have a mental health illness or need medication. he has also deferred any after care planning other than a referral for Primary Care.  He reports he plans to return with his mother and does not have any SI/HI/ or AVH.  He will benefit from an assessment of needs and community referrals for assistance.   Raye Sorrow 11/06/2015

## 2015-11-06 NOTE — Progress Notes (Signed)
Recreation Therapy Notes  Animal-Assisted Activity (AAA) Program Checklist/Progress Notes Patient Eligibility Criteria Checklist & Daily Group note for Rec TxIntervention  Date: 10.03.2017 Time: 2:45pm [address]: [address]    AAA/T Program Assumption of Risk Form signed by Patient/ or Parent Legal Guardian Yes  Patient is free of allergies or sever asthma Yes  Patient reports no fear of animals Yes  Patient reports no history of cruelty to animals Yes  Patient understands his/her participation is voluntary Yes  Behavioral Response: Did not attend. Patient declined to participate in sessions at admission. Noted on consent form.   Marykay Lexenise L Toivo Bordon, LRT/CTRS         Jamin Panther L 11/06/2015 3:02 PM

## 2015-11-06 NOTE — Plan of Care (Signed)
Problem: Activity: Goal: Interest or engagement in leisure activities will improve Outcome: Not Progressing Pt reports feeling fatigue and did not attend evening AA group

## 2015-11-06 NOTE — Progress Notes (Signed)
D:  Patient denies suicidal and homicidal ideation and AVH;  Affect flat; mood depressed; no self-injurious behaviors noted or reported A: Medication given as scheduled; emotional support given; encouraged him to seek assistance with needs/concerns R:  Safety maintained on unit.  Remains on q 15 minute safety checks.

## 2015-11-06 NOTE — Progress Notes (Signed)
LCSW made contact with mother via phone. Mother reports and confirms information in psycho-social and patient is able to return home with her. She voices concern regarding his weight, not eating, withdrawn, sitting in the dark at home for many hours, and no eye contact.  Mother reports patient use to make eye contact and be a social butterfly, but recently he has not been acting like his normal self. She reports she wants his weight up and wants him to address his depression as she feels he is very depressed and not addressing his issues.    Mother reports he has a lot of deep rooted issues (would not elaborate), but that he needs to address and wants to do therapy with him but he refuses. She understands he has that right, but wants him to return to his youthful and happy self.  Patient has refused visitors, but has been calling mother, father, brother, and cousin throughout admission giving them updates.  He also gave permission for LCSW to speak with [employer] in effort to notify them he was [facility] in which LCSW will complete.    Mother updated on treatment plan and asking to hold patient for 30 days. LCSW explained this was not legally acceptable unless patient reported danger to self or others in which he is not currently doing.  Mother voiced understanding.  She was also made aware he is refusing a follow up appointment.  She reports she will talk to him and see if she can get him to do family therapy.  LCSW will follow up.  Deretha EmoryHannah Thaila Bottoms LCSW, MSW Clinical Social Work: Optician, dispensingystem Wide [employer]

## 2015-11-06 NOTE — BHH Suicide Risk Assessment (Signed)
B[facility] INPATIENT:  Family/Significant Other Suicide Prevention Education  Suicide Prevention Education:  Education Completed Stanley Moon  [phone] has been identified by the patient as the family member/significant other with whom the patient will be residing, and identified as the person(s) who will aid the patient in the event of a mental health crisis (suicidal ideations/suicide attempt).  With written consent from the patient, the family member/significant other has been provided the following suicide prevention education, prior to the and/or following the discharge of the patient.  The suicide prevention education provided includes the following:  Suicide risk factors  Suicide prevention and interventions  National Suicide Hotline telephone number  [facility] assessment telephone number  [facility] Emergency Assistance 911  [facility] and/or Residential Mobile Crisis Unit telephone number  Request made of family/significant other to:  Remove weapons (e.g., guns, rifles, knives), all items previously/currently identified as safety concern.    Remove drugs/medications (over-the-counter, prescriptions, illicit drugs), all items previously/currently identified as a safety concern.  The family member/significant other verbalizes understanding of the suicide prevention education information provided.  The family member/significant other agrees to remove the items of safety concern listed above.  Raye SorrowCoble, Justene Jensen N 11/06/2015, 12:31 PM

## 2015-11-06 NOTE — BHH Group Notes (Signed)
Patient did not attend group.

## 2015-11-07 NOTE — Progress Notes (Signed)
Written/verbal information about Anorexia Nervosa given to patient

## 2015-11-07 NOTE — Progress Notes (Signed)
Patient's roommate reported that patient was "up all night making himself throw up".  Roommate said that he told patient to tell his nurse but when patient failed to do so he said that he decided to tell nurse.  Dr. Mckinley Jewelates made aware.  Patient is to be 1:1 during meals.

## 2015-11-07 NOTE — Progress Notes (Signed)
   D: Pt avoided eye contact with the Clinical research associatewriter. When asked if he had any questions pt stated, "they've been pretty adamant." Stated, "I don't have SI, or HI." Pt stated, "I;m not going to group because I just had a conversation with my mom about my transition.   A:  Support and encouragement was offered. 15 min checks continued for safety.  R: Pt remains safe.

## 2015-11-07 NOTE — Progress Notes (Signed)
Patient's mother contacted us and wanted update on patient status. She requested that the doctor and social worker call her.  Dr. Mckinley Jewelates and unit social worker made aware.

## 2015-11-07 NOTE — Progress Notes (Signed)
Recreation Therapy Notes  Date: 11/07/15 Time: 0930 Location: [address] Group Room  Group Topic: Stress Management  Goal Area(s) Addresses:  Patient will verbalize importance of using healthy stress management.  Patient will identify positive emotions associated with healthy stress management.   Intervention: Stress Management  Activity :  Progressive Muscle Relaxation.  LRT introduced the technique of progressive muscle relaxation to the group.  LRT read script to engage patients in the technique.  Patients were to follow along as LRT read a script to participate in activity.  Education:  Stress Management, Discharge Planning.   Education Outcome: Acknowledges edcuation/In group clarification offered/Needs additional education  Clinical Observations/Feedback: Pt did not attend group.     Shearon Clonch, LRT/CTRS         Leora Platt A 11/07/2015 11:50 AM 

## 2015-11-07 NOTE — Progress Notes (Signed)
Incidental note nursing communication  Nursing reports that patient's roommate reports that patient was "in the bathroom all night long making himself throw up."  Assessment and plan: Patient with anorexia nervosa may be engaging in purging behavior. We will check phosphorus and Chem-7 tomorrow. Have ordered daily weights and sitter for meals. Social work is exploring referrals for further treatment. We will consider if patient needs closer monitoring at night.  Acquanetta SitElizabeth Woods Oates, MD

## 2015-11-07 NOTE — Progress Notes (Signed)
LCSW is working with treatment team and mother in order to provide after care for patient.  MD is interested in referrals for eating disorders. [facility]. There is no wait list for Adult patient's at this time. Mother is in agreement with plan.  Barrier:  Community education officernsurance and payment of treatment. For treatment it is 1200.00 a day and program is 90-100 days per intake. Patient lacks insurance at this time. Mother reports they cannot private pay. Mother is working to speak with father to see if patient could go on his father's insurance and if insurance would be accepted and time frame.  Referral on hold at this time due the following, but LCSW is working to follow up with family.  [facility]: still pending information.  LCSW has left message for intake regarding program. Will follow up.  Mother is not interested in out of state options, only [place] options at this time.  Stanley EmoryHannah Tesla Bochicchio LCSW, MSW Clinical Social Work: Optician, dispensingystem [employer] Coverage for :  [employer]

## 2015-11-07 NOTE — Progress Notes (Signed)
Mother in visiting patient; she was given update on patient status.

## 2015-11-07 NOTE — [facility] Group Notes (Signed)
[facility] L[facility]  11/07/2015 3:31 PM  Type of Therapy:  Group Therapy  Participation Level:  Did Not Attend    Raye SorrowCoble, Delwyn Scoggin N 11/07/2015, 3:31 PM

## 2015-11-07 NOTE — Progress Notes (Signed)
D:  Patient denies suicidal and homicidal ideation and AVH;  Affect flat; mood depressed/anxious no self-injurious behaviors noted or reported A: Medication given as scheduled; emotional support given; encouraged him to seek assistance with needs/concerns R:  Safety maintained on unit.  Remains on q 15 minute safety checks.

## 2015-11-07 NOTE — Plan of Care (Signed)
Problem: Safety: Goal: Periods of time without injury will increase Outcome: Progressing Safety maintained on unit   

## 2015-11-07 NOTE — BHH Group Notes (Signed)
Patient attend group for 15 minutes with NA

## 2015-11-07 NOTE — Progress Notes (Signed)
Patient was on 1:1 during dinner meal.  He ate 1 serving green beans/1serving mixed vegetables/1 serving pineapple. He ate 0% of his entree and drank no beverage.

## 2015-11-07 NOTE — Progress Notes (Signed)
Dr. Mckinley Jewelates released patient from involuntary commitment.  She said that patient signed voluntary admission form

## 2015-11-07 NOTE — Progress Notes (Signed)
[facility] Progress [facility]  11/07/2015 2:28 PM  Patient Active Problem List   Diagnosis Date Noted  . Major depressive disorder, single episode, severe (HCC) 11/05/2015  . Major depressive disorder, single episode, severe without psychotic features (HCC) 11/05/2015  . Anorexia nervosa 11/05/2015    Diagnosis: Anorexia nervosa and depression  Subjective: Patient denies any acute complaints and denies any suicidal or homicidal ideation, plan or intent he does describe him see self as feeling "dampened, disappointed and discouraged" over the prospect of having possibly to do more treatment but he accepts that is something that needs to happen. He discusses some of his feelings of being "powerless and voiceless" from being brought here on an involuntary commitment. Today he appeared to understand the need for treatment and did not have any acute suicidal or homicidal ideation plan or intent and does agree to sign in as a voluntary patient. Asked me to call his mother and has given permission for me to talk with her. The patient has been refusing his Remeron as he is conflicted about the idea of taking medications.  Spoke with patient's mother Erroll LunaZarassa Mckenna [phone] as consent is documented on the chart. She reports that for the past 9 months the patient has eaten only one meal a day and it is always a frozen pizza and a vegetable at 11 PM at night. She notes that his mood is not his typical engaging baseline and he seems withdrawn and "keeps his head down." She also notes the onset of the eye fluttering and not meeting peoples gaze that we have seen here. She feels he has become withdrawn and flat and has lost interest in activities.  Objective: Well-developed cachectic male who is mildly malodorous and wearing hospital scrubs he is alert pleasant and appropriate in conversation. He describes his mood as being somewhat disappointed about his current situation. Affect is appropriate. Speech and motor  appear within normal limits. Thought processes are linear and goal directed. Thought content denies current suicidal or homicidal ideation, plan or intent. Alert and oriented 3 insight and judgment are limited IQ appears an average range.  Patient's height today was 5 foot 10 and weight was 101 phosphorus yesterday was 4.4 EKG was unremarkable. Pulse appears to be within normal parameters without bradycardia he may be somewhat hypotensive.  Review of systems patient denies any leg edema pain or difficulty breathing he denies any swelling         Current Facility-Administered Medications (Analgesics):  .  acetaminophen (TYLENOL) tablet 650 mg     Current Facility-Administered Medications (Other):  .  alum & mag hydroxide-simeth (MAALOX/MYLANTA) 200-200-20 MG/5ML suspension 30 mL .  feeding supplement (ENSURE ENLIVE) (ENSURE ENLIVE) liquid 237 mL .  hydrOXYzine (ATARAX/VISTARIL) tablet 25 mg .  magnesium hydroxide (MILK OF MAGNESIA) suspension 30 mL .  mirtazapine (REMERON) tablet 15 mg .  ondansetron (ZOFRAN) tablet 4 mg .  thiamine (VITAMIN B-1) tablet 100 mg  No current outpatient prescriptions on file.  Vital Signs:Blood pressure (!) 98/58, pulse 86, temperature 98.5 F (36.9 C), temperature source Oral, resp. rate 16, height 5\' 10"  (1.778 m), weight 45.8 kg (101 lb), SpO2 100 %.    Lab Results:  Results for orders placed or performed during the hospital encounter of 11/05/15 (from the past 48 hour(s))  Phosphorus     Status: None   Collection Time: 11/06/15  6:23 AM  Result Value Ref Range   Phosphorus 4.4 2.5 - 4.6 mg/dL  Comment: Performed at [facility]  Vitamin B12     Status: None   Collection Time: 11/06/15  6:23 AM  Result Value Ref Range   Vitamin B-12 544 180 - 914 pg/mL    Comment: (NOTE) This assay is not validated for testing neonatal or myeloproliferative syndrome specimens for Vitamin B12 levels. Performed at [facility]     Physical Findings: AIMS: Facial and Oral Movements Muscles of Facial Expression: None, normal Lips and Perioral Area: None, normal Jaw: None, normal Tongue: None, normal,Extremity Movements Upper (arms, wrists, hands, fingers): None, normal Lower (legs, knees, ankles, toes): None, normal, Trunk Movements Neck, shoulders, hips: None, normal, Overall Severity Severity of abnormal movements (highest score from questions above): None, normal Incapacitation due to abnormal movements: None, normal Patient's awareness of abnormal movements (rate only patient's report): No Awareness, Dental Status Current problems with teeth and/or dentures?: No Does patient usually wear dentures?: No  CIWA:    COWS:      Assessment/Plan: The patient is cooperating to a certain extent with treatment. He does appear to except that he will need further treatment for his conditions. He reports eating some rice and vegetables but he is not able to bring himself to eat meat. It appears that his weight has stayed in the same range and he has not gained weight since he was here. So far no sign or complaint of any leg swelling or other symptoms of refeeding and phosphorus was good. We'll order repeat check of phosphorus and still holding steady can increase the number of Ensure as offered. We will order daily weights and will explore having nursing's it next to patient at meals and no take his intake. Social work is exploring options for the patient for further follow up for treatment.  Acquanetta Sit, MD 11/07/2015, 2:28 PM

## 2015-11-08 LAB — BASIC METABOLIC PANEL
Anion gap: 8 (ref 5–15)
BUN: 17 mg/dL (ref 6–20)
CHLORIDE: 99 mmol/L — AB (ref 101–111)
CO2: 29 mmol/L (ref 22–32)
Calcium: 9.8 mg/dL (ref 8.9–10.3)
Creatinine, Ser: 1.03 mg/dL (ref 0.61–1.24)
GFR calc Af Amer: >60 mL/min (ref >60)
GFR calc non Af Amer: >60 mL/min (ref >60)
Glucose, Bld: 86 mg/dL (ref 65–99)
POTASSIUM: 3.7 mmol/L (ref 3.5–5.1)
SODIUM: 136 mmol/L (ref 135–145)

## 2015-11-08 LAB — PHOSPHORUS: Phosphorus: 4 mg/dL (ref 2.5–4.6)

## 2015-11-08 MED ORDER — ENSURE ENLIVE PO LIQD
237.0000 mL | Freq: Three times a day (TID) | ORAL | Status: DC | PRN
  Administered 2015-11-09 – 2015-11-11 (×6): 237 mL via ORAL
  Administered 2015-11-12: 88.72 mL via ORAL
  Administered 2015-11-12: 355.5 mL via ORAL
  Administered 2015-11-12 – 2015-11-14 (×6): 237 mL via ORAL
  Filled 2015-11-08 (×14): qty 237

## 2015-11-08 NOTE — BHH Group Notes (Signed)
B[facility] LCSW Group Therapy  11/08/2015 3:16 PM  Type of Therapy:  Group Therapy  Participation Level:  Did Not Attend  Modes of Intervention:  Activity, Discussion, Education, Socialization and Support  Summary of Progress/Problems: Balance in life: Patients will discuss the concept of balance and how it looks and feels to be unbalanced. Pt will identify areas in their life that is unbalanced and ways to become more balanced.   Holland Nickson L Mikyah Alamo MSW, LCSWA  11/08/2015, 3:16 PM   

## 2015-11-08 NOTE — Progress Notes (Signed)
NUTRITION ASSESSMENT  RD consulted for the following: "Pt admitted 10/2 with anoxeria nervosa (no prior treatment or dx) ht 70" current wt c 101 lb (circa 65% body weight. So far no refeeding syndrome seen, but pt is still restricting--denies purging but roommate states he has. Pt is overexercising at home, but not observed here. Mother reports last nine months only eats one meal a day--always a frozen pizza and vegetable at 1100 pm.  Current treatment daily weights, checking EKG, phosphorus, chemistries, 1:1 at meals started to record intake with prn ensure for noncompletion, trying to find program for pt (no insurance)  ? May need transfer to inpt medical floor? Other suggestions also appreciated "  INTERVENTION: 1. Recommend transfer to inpatient treatment facility for eating disorders 2. Discussed goals of treatment with MD 3. Supplements: Ensure Enlive po TID, each supplement provides 350 kcal and 20 grams of protein 4. Each meal should consist of a protein, starch, vegetable, fruit and dairy.  (Based on meal completion: Provide 12 oz if PO intake is 0-24%, 9 oz -25%, 6 oz -50%, 75-99% -3 oz.) 5. Recommend monitor patient 1-2 hours after meals to observe any exercising/purging. 6. Monitor magnesium, potassium, and phosphorus daily for at least 3 days as pt is at risk for refeeding syndrome given poor PO intake x 9 months PTA. 7. Reviewed plan with RN and requested meal and supplement completion to be documented in chart.  8. Please consult/page for additional needs.  NUTRITION DIAGNOSIS: Unintentional weight loss related to sub-optimal intake as evidenced by pt report.   Goal: Pt to meet >/= 90% of their estimated nutrition needs.  Monitor:  PO intake  Assessment:  Pt admitted with anorexia nervosa and depression.  RD spoke with MD regarding pt's history. MD was visited by SW during visit and was [facility] and [facility]. Depending  [facility], will reinforce refeeding strategies. Recommended Mg be monitored along with Phos and K for refeeding syndrome. Pt has eaten very little to see signs so far.  Pt has recieved Thiamine shots. Pt is monitored 1:1 during mealtimes. Recommend monitor pt 1-2 hours after meals as well.  Per staff: for dinner on 10/4: pt consumed 1 serving of green beans, mixed vegetables, and pineapple (~150 kcal, 1 g protein). For breakfast this morning: 100% completion of eggs, bacon, grits (490 kcal, 14g protein).  For 9 months PTA, pt consumed mainly 1 meal a day consisting of a frozen pizza and a vegetable.   Per RN, pt has lost 6 lb since admission (not located in chart). This would be a 5% wt loss x 3 days.   Height: Ht Readings from Last 1 Encounters:  11/07/15 5\' 10"  (1.778 m)    Weight: Wt Readings from Last 1 Encounters:  11/08/15 101 lb 11.2 oz (46.1 kg)    Weight Hx: Wt Readings from Last 10 Encounters:  11/08/15 101 lb 11.2 oz (46.1 kg)  08/29/13 135 lb (61.2 kg)    BMI:  Body mass index is 14.59 kg/m. Pt meets criteria for underweight based on current BMI.  Estimated Nutritional Needs: Kcal: 25-30 kcal/kg Protein: > 1 gram protein/kg Fluid: 1 ml/kcal  Diet Order: Diet Heart Room service appropriate? Yes; Fluid consistency: Thin Pt is also offered choice of unit snacks mid-morning and mid-afternoon.  Pt is eating as desired.   Lab results and medications reviewed.   Tilda FrancoLindsey Markesia Crilly, MS, RD, LDN Pager: [phone] After Hours Pager: [phone]

## 2015-11-08 NOTE — Progress Notes (Signed)
D    Pt is depressed and sad   He isolated to his room most of the shift   He does not engage in interactions with others A    Verbal support offered   Medications offered    Q 15 min checks R   Pt remains safe

## 2015-11-08 NOTE — Progress Notes (Signed)
Patient did not Recruitment consultantengage writer in conversation this shift.  But was heard saying "I don't want to go to another treatment facility.  I am tired of being in them."  Patient was noted to have eaten 100% of his breakfast, 50% of his lunch and 75% of his dinner.  Patient's daily weight was 101lbs and 7oz  Assess patient for safety, offer medications as prescribed,  Continue to monitor

## 2015-11-08 NOTE — Progress Notes (Signed)
  D: When asked about his day pt stated, "just an ordinary, regular day". Pt continues to avoid eye contact, either by turning his head,  fluttering his eyes or rolling them back into his head. When asked about group pt stated, "it sounds mandatory", got up and walked into the room. Pt doesn't engage in conversation with the Clinical research associatewriter. Pt has no questions or concerns.    A:  Support and encouragement was offered. 15 min checks continued for safety.  R: Pt remains safe.

## 2015-11-08 NOTE — Progress Notes (Signed)
[facility] MD Progress Note  11/08/2015 11:12 AM  Patient Active Problem List   Diagnosis Date Noted  . Major depressive disorder, single episode, severe (HCC) 11/05/2015  . Major depressive disorder, single episode, severe without psychotic features (HCC) 11/05/2015  . Anorexia nervosa 11/05/2015    Diagnosis: Anorexia nervosa  Subjective: The patient denies any current suicidal or homicidal ideation, plan or intent. He states his mood is all right although he is still trying to process his need for treatment. He does state however he will be cooperative with follow-up.  He denies any purging behavior, although his roommate had reported the day before last he was attempting to vomit in the bathroom.  No excessive exercising has been noted.  Objective: Well-developed cachectic male sitting up in bed in no apparent distress pleasant and cooperative speech and motor appear within normal limits thought processes linear and goal-directed thought content denies suicidal or homicidal ideation plan or intent or thoughts of self-harm alert and oriented 3 insight and judgment are limited IQ appears an average range  Patient has been placed on observation on meals and his intake is to be recorded he was verbally reported that he did eat well at breakfast yesterday it is recorded that he ate 0% of his entre and 0% beverage he ate one serving of green beans one serving of mixed vegetables and a serving of pineapple weight today 101 pounds 11.2 ounces vitals showed some orthostatic hypotension with blood pressure 101/74 with pulse of 93 standing 113/72 with pulse of 63 sitting.  Phosphorus today 4.0         Current Facility-Administered Medications (Analgesics):  .  acetaminophen (TYLENOL) tablet 650 mg     Current Facility-Administered Medications (Other):  .  alum & mag hydroxide-simeth (MAALOX/MYLANTA) 200-200-20 MG/5ML suspension 30 mL .  feeding supplement (ENSURE ENLIVE) (ENSURE ENLIVE)  liquid 237 mL .  hydrOXYzine (ATARAX/VISTARIL) tablet 25 mg .  magnesium hydroxide (MILK OF MAGNESIA) suspension 30 mL .  mirtazapine (REMERON) tablet 15 mg .  ondansetron (ZOFRAN) tablet 4 mg .  thiamine (VITAMIN B-1) tablet 100 mg  No current outpatient prescriptions on file.  Vital Signs:Blood pressure 101/74, pulse 93, temperature 98 F (36.7 C), temperature source Oral, resp. rate 18, height 5\' 10"  (1.778 m), weight 46.1 kg (101 lb 11.2 oz), SpO2 100 %.    Lab Results:  Results for orders placed or performed during the hospital encounter of 11/05/15 (from the past 48 hour(s))  Phosphorus     Status: None   Collection Time: 11/08/15  6:41 AM  Result Value Ref Range   Phosphorus 4.0 2.5 - 4.6 mg/dL    Comment: Performed at [facility]  Basic metabolic panel     Status: Abnormal   Collection Time: 11/08/15  6:41 AM  Result Value Ref Range   Sodium 136 135 - 145 mmol/L   Potassium 3.7 3.5 - 5.1 mmol/L   Chloride 99 (L) 101 - 111 mmol/L   CO2 29 22 - 32 mmol/L   Glucose, Bld 86 65 - 99 mg/dL   BUN 17 6 - 20 mg/dL   Creatinine, Ser 01/08/16 0.61 - 1.24 mg/dL   Calcium 9.8 8.9 - 0.74 mg/dL   GFR calc non Af Amer >60 >60 mL/min   GFR calc Af Amer >60 >60 mL/min    Comment: (NOTE) The eGFR has been calculated using the CKD EPI equation. This calculation has not been validated in all clinical situations. eGFR's persistently <60 mL/min  signify possible Chronic Kidney Disease.    Anion gap 8 5 - 15    Comment: Performed at [facility]    Physical Findings: AIMS: Facial and Oral Movements Muscles of Facial Expression: None, normal Lips and Perioral Area: None, normal Jaw: None, normal Tongue: None, normal,Extremity Movements Upper (arms, wrists, hands, fingers): None, normal Lower (legs, knees, ankles, toes): None, normal, Trunk Movements Neck, shoulders, hips: None, normal, Overall Severity Severity of abnormal movements (highest score  from questions above): None, normal Incapacitation due to abnormal movements: None, normal Patient's awareness of abnormal movements (rate only patient's report): No Awareness, Dental Status Current problems with teeth and/or dentures?: No Does patient usually wear dentures?: No  CIWA:    COWS:      Assessment/Plan: Patient remains calm pleasant and cooperative but limited in insight. Nutrition consult has been placed which is necessary for any further outpatient follow-up and also for recommendations such as does he need to be moved currently to the inpatient medical facility for further treatment. His phosphorous has dropped somewhat but remains well within the normal range he shows some mild orthostasis but no bradycardia. He is still doing some restricting. There is a question of purging. Social work is working on Community education officer for further treatment at this time. We will continue with inpatient treatment for now and continue monitoring his condition.  Linard Millers, MD 11/08/2015, 11:12 AM

## 2015-11-08 NOTE — [facility] Group Notes (Signed)

## 2015-11-08 NOTE — Clinical Social Work Note (Signed)
CSW returned call to [facility], patient's mother.  Mother wanted to know if referral to [facility] has been sent, confirmed that it has.  Santa GeneraAnne Cunningham, LCSW Lead Clinical Social Worker Phone:  [phone]

## 2015-11-09 LAB — BASIC METABOLIC PANEL
Anion gap: 8 (ref 5–15)
BUN: 13 mg/dL (ref 6–20)
CHLORIDE: 101 mmol/L (ref 101–111)
CO2: 29 mmol/L (ref 22–32)
CREATININE: 0.98 mg/dL (ref 0.61–1.24)
Calcium: 9.7 mg/dL (ref 8.9–10.3)
GFR calc Af Amer: >60 mL/min (ref >60)
GFR calc non Af Amer: >60 mL/min (ref >60)
Glucose, Bld: 88 mg/dL (ref 65–99)
POTASSIUM: 3.5 mmol/L (ref 3.5–5.1)
Sodium: 138 mmol/L (ref 135–145)

## 2015-11-09 LAB — PHOSPHORUS: Phosphorus: 4 mg/dL (ref 2.5–4.6)

## 2015-11-09 LAB — MAGNESIUM: Magnesium: 1.9 mg/dL (ref 1.7–2.4)

## 2015-11-09 NOTE — Progress Notes (Signed)
Patient ID: [name], male   DOB: 02/19/1990, 25 y.o.   MRN: [id number]   CSW attempted to contact Stanley DoryLaurie Moon, at [phone] with [facility] regarding inpatient referral. CSW left message requesting call back.   Stanley FloroCandace L [name] MSW, LCSWA  11/09/2015 11:39 AM

## 2015-11-09 NOTE — Progress Notes (Signed)
Patient has been isolative this shift.  Patient has been restriction meals today, but he has consumed all of his ensures as prescribed, continue to encourage patient to eat at all meals.

## 2015-11-09 NOTE — Progress Notes (Signed)
NUTRITION NOTE  Pt seen by another RD yesterday with note written 10/5 at 1353. Per RN note yesterday at 1926, pt consumed 100% of breakfast, 50% of lunch, and 75% of dinner and weight yesterday was 101 lbs and 7 oz. Chart reviewed for all information today/contained within this note.   Per Psychiatry note this AM, pt does not have any negative thoughts or feelings concerning food, eating, or increasing PO intakes and pt denied abdominal pain with intakes. Note indicates weight in gown today is 103 lbs. Mg and Phos remain stable per note and plan to continue to check these labs over the weekend.   Per RN note this AM, pt only consumed grape juice for breakfast and was subsequently given 12 ounces of Ensure Enlive to supplement; note states pt appeared to almost vomit several times with intake of Ensure. Will continue to monitor PO intakes of meals and supplements and adverse reactions to these.   Social Work note indicates voicemail left for [employer] [facility] Coordinator [facility].   RD will follow-up 10/9 [facility].    Stanley GammonJessica Rozena Fierro, MS, RD, LDN Inpatient Clinical Dietitian Pager # [phone] After hours/weekend pager # [phone]

## 2015-11-09 NOTE — Progress Notes (Addendum)
[facility] MD Progress Note  11/09/2015 10:30 AM  Patient Active Problem List   Diagnosis Date Noted  . Major depressive disorder, single episode, severe ([place]) 11/05/2015  . Major depressive disorder, single episode, severe without psychotic features ([place]) 11/05/2015  . Anorexia nervosa 11/05/2015    Diagnosis: Anorexia nervosa, depression  Subjective: Patient denies any acute concerns. Per nursing report he 100% of breakfast 50% lunch and 75% of dinner yesterday. He reports "I already put my mind in a mental state that this was going to occur" in terms of changing his eating habits and he denies having any negative reaction or feeling any different mood with eating more "no rise in anxiety" and "there wasn't depression." He denies feeling any pain or abdominal discomfort and states his bowel movements remain regular. He denies any suicidal or homicidal ideation, plan or intent or any psychotic symptoms.  Objective: Well-developed cachectic male resting in bed. In no apparent distress. Speech and motor appear within normal limits thought processes are linear and goal directed but content denies excessive anxiety or depression currently denies suicidal or homicidal ideation, plan or intent. Alert and oriented 3 insight and judgment are limited IQ appears an average range  Weight 10/5 46.1 kilograms Weight 10/6 50.3 kg  Reweighed in gown later in the morning and weight 103 pounds. 10/6 magnesium 1.9 phosphorous 4.0 Chem-7 unremarkable        Current Facility-Administered Medications (Analgesics):  .  acetaminophen (TYLENOL) tablet 650 mg     Current Facility-Administered Medications (Other):  .  alum & mag hydroxide-simeth (MAALOX/MYLANTA) 200-200-20 MG/5ML suspension 30 mL .  feeding supplement (ENSURE ENLIVE) (ENSURE ENLIVE) liquid 237 mL .  hydrOXYzine (ATARAX/VISTARIL) tablet 25 mg .  magnesium hydroxide (MILK OF MAGNESIA) suspension 30 mL .  mirtazapine (REMERON) tablet 15 mg .   ondansetron (ZOFRAN) tablet 4 mg .  thiamine (VITAMIN B-1) tablet 100 mg  No current outpatient prescriptions on file.  Vital Signs:Blood pressure 109/81, pulse 82, temperature 97.6 F (36.4 C), resp. rate 18, height 5' 10" (1.778 m), weight 50.3 kg (111 lb), SpO2 100 %.    Lab Results:  Results for orders placed or performed during the hospital encounter of 11/05/15 (from the past 48 hour(s))  Phosphorus     Status: None   Collection Time: 11/08/15  6:41 AM  Result Value Ref Range   Phosphorus 4.0 2.5 - 4.6 mg/dL    Comment: Performed at [place] metabolic panel     Status: Abnormal   Collection Time: 11/08/15  6:41 AM  Result Value Ref Range   Sodium 136 135 - 145 mmol/L   Potassium 3.7 3.5 - 5.1 mmol/L   Chloride 99 (L) 101 - 111 mmol/L   CO2 29 22 - 32 mmol/L   Glucose, Bld 86 65 - 99 mg/dL   BUN 17 6 - 20 mg/dL   Creatinine, Ser 1.03 0.61 - 1.24 mg/dL   Calcium 9.8 8.9 - 10.3 mg/dL   GFR calc non Af Amer >60 >60 mL/min   GFR calc Af Amer >60 >60 mL/min    Comment: (NOTE) The eGFR has been calculated using the CKD EPI equation. This calculation has not been validated in all clinical situations. eGFR's persistently <60 mL/min signify possible Chronic Kidney Disease.    Anion gap 8 5 - 15    Comment: Performed at [facility]  Magnesium     Status: None   Collection Time: 11/09/15  6:43 AM  Result Value Ref Range   Magnesium 1.9 1.7 - 2.4 mg/dL    Comment: Performed at [facility]  Phosphorus     Status: None   Collection Time: 11/09/15  6:43 AM  Result Value Ref Range   Phosphorus 4.0 2.5 - 4.6 mg/dL    Comment: Performed at [place] metabolic panel     Status: None   Collection Time: 11/09/15  6:43 AM  Result Value Ref Range   Sodium 138 135 - 145 mmol/L   Potassium 3.5 3.5 - 5.1 mmol/L   Chloride 101 101 - 111 mmol/L   CO2 29 22 - 32 mmol/L   Glucose, Bld 88 65 -  99 mg/dL   BUN 13 6 - 20 mg/dL   Creatinine, Ser 0.98 0.61 - 1.24 mg/dL   Calcium 9.7 8.9 - 10.3 mg/dL   GFR calc non Af Amer >60 >60 mL/min   GFR calc Af Amer >60 >60 mL/min    Comment: (NOTE) The eGFR has been calculated using the CKD EPI equation. This calculation has not been validated in all clinical situations. eGFR's persistently <60 mL/min signify possible Chronic Kidney Disease.    Anion gap 8 5 - 15    Comment: Performed at [facility]    Physical Findings: AIMS: Facial and Oral Movements Muscles of Facial Expression: None, normal Lips and Perioral Area: None, normal Jaw: None, normal Tongue: None, normal,Extremity Movements Upper (arms, wrists, hands, fingers): None, normal Lower (legs, knees, ankles, toes): None, normal, Trunk Movements Neck, shoulders, hips: None, normal, Overall Severity Severity of abnormal movements (highest score from questions above): None, normal Incapacitation due to abnormal movements: None, normal Patient's awareness of abnormal movements (rate only patient's report): No Awareness, Dental Status Current problems with teeth and/or dentures?: No Does patient usually wear dentures?: No  CIWA:    COWS:      Assessment/Plan: Patient was consulted to the dietitian and her recommendations implemented. Continue to provide support to nursing for managing this specific treatment plan for an eating disorder patient. We will order waits to be taken in a gown has appears he has gained about 10 pounds in the course of the day based on the waits recorded. We will monitor for any issues with fluid shifts, so far magnesium and phosphorus have been holding steady. Covering M.D. is aware to check labs for the weekend. Social work has placed a referral to the [facility] eating disorders unit. We are also exploring potential IOP and other facilities. We will continue to monitor the patient's progress and make adjustments as needed.  Linard Millers, MD 11/09/2015, 10:30 AM

## 2015-11-09 NOTE — Progress Notes (Signed)
Patient ID: Stanley Moon, male   DOB: 04/30/1990, 25 y.o.   MRN: [id number]  CSW received message from Kerry DoryLaurie Gardner from [facility]. She states they are unable to provide services to adult males due to semi private rooms available. Also, pt does not currently have insurance which creates another barrier for the program. Mrs. Julian ReilGardner states she will staff the referral with her colleagues on Monday to determine services that can be offered.   Daisy FloroCandace L Livie Vanderhoof MSW, LCSWA  11/09/2015 2:17 PM

## 2015-11-09 NOTE — Progress Notes (Signed)
Recreation Therapy Notes  Date: 11/09/15 Time: 0930 Location: 300 Hall Dayroom  Group Topic: Stress Management  Goal Area(s) Addresses:  Patient will verbalize importance of using healthy stress management.  Patient will identify positive emotions associated with healthy stress management.   Intervention: Stress Management  Activity :  [place].  LRT introduced the technique of guided imagery.  LRT read script allowing patients to participate and engage in the technique.  Patients were to follow along as LRT read script.  Education:  Stress Management, Discharge Planning.   Education Outcome: Acknowledges edcuation/In group clarification offered/Needs additional education  Clinical Observations/Feedback: Pt did not attend group.     Uchechi [place], LRT/CTRS         Taura Lamarre A 11/09/2015 12:02 PM 

## 2015-11-09 NOTE — Progress Notes (Signed)
Patient reported only drinking grape juice for breakfast.   Patient was given 12 ounces of ensure to supplement his meal.  Patient daily weigh was re checked and noted to be 103 lbs.  Patient had a difficult time trying to keep the ensure ingested and appeared to almost vomit several times.   Patient was monitored in the day room for a half hour after consuming ensure.

## 2015-11-09 NOTE — Progress Notes (Signed)
Did not attend group 

## 2015-11-09 NOTE — Clinical Social Work Note (Addendum)
CSW contacted Kerry DoryLaurie Gardner, intake coordinator for [facility] [phone]), [facility] to [facility], left VM requesting call back.  Santa GeneraAnne Cunningham, LCSW Lead Clinical Social Worker Phone:  [phone]

## 2015-11-10 DIAGNOSIS — F5000 Anorexia nervosa, unspecified: Secondary | ICD-10-CM

## 2015-11-10 DIAGNOSIS — Z79899 Other long term (current) drug therapy: Secondary | ICD-10-CM

## 2015-11-10 LAB — BASIC METABOLIC PANEL
Anion gap: 6 (ref 5–15)
BUN: 12 mg/dL (ref 6–20)
CHLORIDE: 101 mmol/L (ref 101–111)
CO2: 30 mmol/L (ref 22–32)
CREATININE: 0.83 mg/dL (ref 0.61–1.24)
Calcium: 9.7 mg/dL (ref 8.9–10.3)
GFR calc Af Amer: >60 mL/min (ref >60)
GFR calc non Af Amer: >60 mL/min (ref >60)
Glucose, Bld: 79 mg/dL (ref 65–99)
Potassium: 3.9 mmol/L (ref 3.5–5.1)
SODIUM: 137 mmol/L (ref 135–145)

## 2015-11-10 LAB — MAGNESIUM: MAGNESIUM: 2 mg/dL (ref 1.7–2.4)

## 2015-11-10 LAB — PHOSPHORUS: Phosphorus: 3.6 mg/dL (ref 2.5–4.6)

## 2015-11-10 NOTE — Progress Notes (Signed)
D    Pt was pleasant and cooperative    He isolated to his to his room and did not attend group and does not interact with others    A    Verbal support given    Medications administered and effectiveness monitored    Q 15 min checks R    Pt is safe at present

## 2015-11-10 NOTE — Progress Notes (Signed)
[facility] MD Progress Note  11/10/2015 4:46 PM Stanley Moon  MRN:  [id number] Subjective: patient reports feeling fine today. States " I just want to do whatever I can to get me out of this hospital. Patient reports his mother had concerns.   Objective:Stanley Moon is awake, alert and oriented X4. Seen resting in bedroom.  Denies suicidal or homicidal ideation during this assessment. Denies auditory or visual hallucination and does not appear to be responding to internal stimuli.  Patient reports he is medication compliant without mediation side effects. Reports he feels like his appetite is improving. States that his weight and appetite is not a problem or issue for him. Patient reports resting well throughout the night. Patient report he feels ready for discharge. Support, encouragement and reassurance was provided.  Principal Problem: Anorexia nervosa Diagnosis:   Patient Active Problem List   Diagnosis Date Noted  . Major depressive disorder, single episode, severe ([place]) [F32.2] 11/05/2015  . Major depressive disorder, single episode, severe without psychotic features ([place]) [F32.2] 11/05/2015  . Anorexia nervosa [F50.00] 11/05/2015   Total Time spent with patient: 30 minutes  Past Psychiatric History: See above  Past Medical History:  Past Medical History:  Diagnosis Date  . Asthma    as a child, "I haven't used inhalers in years"   History reviewed. No pertinent surgical history. Family History: History reviewed. No pertinent family history. Family Psychiatric  History: See above Social History:  History  Alcohol Use No     History  Drug Use No    Social History   Social History  . Marital status: Single    Spouse name: N/A  . Number of children: N/A  . Years of education: N/A   Social History Main Topics  . Smoking status: Never Smoker  . Smokeless tobacco: Never Used  . Alcohol use No  . Drug use: No  . Sexual activity: Not Asked   Other Topics Concern  . None    Social History Narrative  . None   Additional Social History:    Pain Medications: Denies use Prescriptions: Denies use Over the Counter: Denies abuse History of alcohol / drug use?: Yes Negative Consequences of Use:  (None reported) Withdrawal Symptoms: Other (Comment) (No history of withdrawal symptoms) Name of Substance 1: Marijuana 1 - Age of First Use: unknown 1 - Amount (size/oz): 1 gram or less 1 - Frequency: 1-2 times per week 1 - Duration: "few years" 1 - Last Use / Amount: 2 weeks ago                  Sleep: Fair  Appetite:  Poor- improving ( pt placed on 1:1 with meals)  Current Medications: Current Facility-Administered Medications  Medication Dose Route Frequency Provider Last Rate Last Dose  . acetaminophen (TYLENOL) tablet 650 mg  650 mg Oral Q6H PRN Patrecia Pour, NP      . alum & mag hydroxide-simeth (MAALOX/MYLANTA) 200-200-20 MG/5ML suspension 30 mL  30 mL Oral Q4H PRN Patrecia Pour, NP      . feeding supplement (ENSURE ENLIVE) (ENSURE ENLIVE) liquid 237 mL  237 mL Oral TID BM PRN Linard Millers, MD   237 mL at 11/10/15 0904  . hydrOXYzine (ATARAX/VISTARIL) tablet 25 mg  25 mg Oral TID PRN Patrecia Pour, NP   25 mg at 11/07/15 1857  . magnesium hydroxide (MILK OF MAGNESIA) suspension 30 mL  30 mL Oral Daily PRN Patrecia Pour, NP      .  mirtazapine (REMERON) tablet 15 mg  15 mg Oral QHS Patrecia Pour, NP   15 mg at 11/09/15 2121  . ondansetron (ZOFRAN) tablet 4 mg  4 mg Oral Q8H PRN Patrecia Pour, NP      . thiamine (VITAMIN B-1) tablet 100 mg  100 mg Oral Daily Linard Millers, MD   100 mg at 11/10/15 9563    Lab Results:  Results for orders placed or performed during the hospital encounter of 11/05/15 (from the past 48 hour(s))  Magnesium     Status: None   Collection Time: 11/09/15  6:43 AM  Result Value Ref Range   Magnesium 1.9 1.7 - 2.4 mg/dL    Comment: Performed at [facility]  Phosphorus     Status:  None   Collection Time: 11/09/15  6:43 AM  Result Value Ref Range   Phosphorus 4.0 2.5 - 4.6 mg/dL    Comment: Performed at [place] metabolic panel     Status: None   Collection Time: 11/09/15  6:43 AM  Result Value Ref Range   Sodium 138 135 - 145 mmol/L   Potassium 3.5 3.5 - 5.1 mmol/L   Chloride 101 101 - 111 mmol/L   CO2 29 22 - 32 mmol/L   Glucose, Bld 88 65 - 99 mg/dL   BUN 13 6 - 20 mg/dL   Creatinine, Ser 0.98 0.61 - 1.24 mg/dL   Calcium 9.7 8.9 - 10.3 mg/dL   GFR calc non Af Amer >60 >60 mL/min   GFR calc Af Amer >60 >60 mL/min    Comment: (NOTE) The eGFR has been calculated using the CKD EPI equation. This calculation has not been validated in all clinical situations. eGFR's persistently <60 mL/min signify possible Chronic Kidney Disease.    Anion gap 8 5 - 15    Comment: Performed at [facility]  Magnesium     Status: None   Collection Time: 11/10/15  6:41 AM  Result Value Ref Range   Magnesium 2.0 1.7 - 2.4 mg/dL    Comment: Performed at [facility]  Phosphorus     Status: None   Collection Time: 11/10/15  6:41 AM  Result Value Ref Range   Phosphorus 3.6 2.5 - 4.6 mg/dL    Comment: Performed at [place] metabolic panel     Status: None   Collection Time: 11/10/15  6:41 AM  Result Value Ref Range   Sodium 137 135 - 145 mmol/L   Potassium 3.9 3.5 - 5.1 mmol/L   Chloride 101 101 - 111 mmol/L   CO2 30 22 - 32 mmol/L   Glucose, Bld 79 65 - 99 mg/dL   BUN 12 6 - 20 mg/dL   Creatinine, Ser 0.83 0.61 - 1.24 mg/dL   Calcium 9.7 8.9 - 10.3 mg/dL   GFR calc non Af Amer >60 >60 mL/min   GFR calc Af Amer >60 >60 mL/min    Comment: (NOTE) The eGFR has been calculated using the CKD EPI equation. This calculation has not been validated in all clinical situations. eGFR's persistently <60 mL/min signify possible Chronic Kidney Disease.    Anion gap 6 5 - 15    Comment:  Performed at [facility]    Blood Alcohol level:  Lab Results  Component Value Date   [facility] <5 87/56/4332    Metabolic Disorder Labs: No results found for: HGBA1C, MPG No  results found for: PROLACTIN No results found for: CHOL, TRIG, HDL, CHOLHDL, VLDL, LDLCALC  Physical Findings: AIMS: Facial and Oral Movements Muscles of Facial Expression: None, normal Lips and Perioral Area: None, normal Jaw: None, normal Tongue: None, normal,Extremity Movements Upper (arms, wrists, hands, fingers): None, normal Lower (legs, knees, ankles, toes): None, normal, Trunk Movements Neck, shoulders, hips: None, normal, Overall Severity Severity of abnormal movements (highest score from questions above): None, normal Incapacitation due to abnormal movements: None, normal Patient's awareness of abnormal movements (rate only patient's report): No Awareness, Dental Status Current problems with teeth and/or dentures?: No Does patient usually wear dentures?: No  CIWA:    COWS:     Musculoskeletal: Strength & Muscle Tone: within normal limits and decreased Gait & Station: normal Patient leans: N/A  Psychiatric Specialty Exam: Physical Exam  Nursing note and vitals reviewed. Constitutional: He is oriented to person, place, and time. He appears well-developed.  HENT:  Head: Normocephalic.  Neurological: He is alert and oriented to person, place, and time.  Psychiatric: He has a normal mood and affect. His behavior is normal.    Review of Systems  Psychiatric/Behavioral: Positive for depression. The patient is nervous/anxious and has insomnia.     Blood pressure 98/76, pulse 76, temperature 98.1 F (36.7 C), resp. rate 16, height '5\' 10"'$  (1.778 m), weight 45.8 kg (101 lb), SpO2 100 %.Body mass index is 14.49 kg/m.  General Appearance: Casual  Eye Contact:  Good  Speech:  Clear and Coherent  Volume:  Normal  Mood:  Depressed and Irritable  Affect:  Appropriate and Blunt   Thought Process:  Coherent  Orientation:  Full (Time, Place, and Person)  Thought Content:  Hallucinations: None  Suicidal Thoughts:  No  Homicidal Thoughts:  No  Memory:  Immediate;   Fair Recent;   Fair Remote;   Fair  Judgement:  Fair  Insight:  Present  Psychomotor Activity:  Restlessness  Concentration:  Concentration: Fair  Recall:  Good  Fund of Knowledge:  Fair  Language:  Fair  Akathisia:  No  Handed:  Right  AIMS (if indicated):     Assets:  Communication Skills Desire for Improvement Resilience Talents/Skills  ADL's:  Intact  Cognition:  WNL  Sleep:  Number of Hours: 6.75     I agree with current treatment plan on 11/10/2015, Patient seen face-to-face for psychiatric evaluation follow-up, chart reviewed. Reviewed the information documented and agree with the treatment plan.  Treatment Plan Summary: Daily contact with patient to assess and evaluate symptoms and progress in treatment and Medication management   CSW received message from Ennis Forts from [facility] for [facility] disorders Labs orders for BMP, magnesium and phosphorus Continue with Remeron 15 mg for mood stabilization/ insomnia  Will continue to monitor vitals ,medication compliance and treatment side effects while patient is here.  Reviewed labs; chloride 99 decreased,BAL - , UDS -. CSW will start working on disposition.  Patient to participate in therapeutic milieu   Derrill [place], NP 11/10/2015, 4:46 PM   Agree with NP Progress Note as above

## 2015-11-10 NOTE — Progress Notes (Signed)
Patient denies SI, HI and AVH this shift.  Patient ate 50 percent of his breakfast which was supplemented by ensure according to order.  Patient ate 65 percent of his lunch meal which was supplemented by ensure per doctor's order.  Patient is compliant with medications and therapy.    Assess patient for safety, offer medications as prescribed, engage patient in 1:1 staff talks.  Continue to monitor as planned.

## 2015-11-10 NOTE — Progress Notes (Signed)
D    Pt was pleasant and cooperative    He isolated to his to his room and did not attend group and does not interact with others    A    Verbal support given    Medications administered and effectiveness monitored    Q 15 min checks R    Pt is safe at present 

## 2015-11-10 NOTE — Progress Notes (Signed)
Pt was invited to attend the evening AA speaker meeting. Pt remained in bed instead.

## 2015-11-10 NOTE — [facility] Group Notes (Signed)
[facility] Group Notes: (Clinical Social Work)   11/10/2015      Type of Therapy:  Group Therapy   Participation Level:  Did Not Attend - Was present for the first 10 minutes of group, stated "I pass" when asked his name and one healthy coping technique he uses to deal with problems, then got up and left, did not return.   Ambrose Mantle, LCSW 11/10/2015, 11:02 AM

## 2015-11-10 NOTE — Progress Notes (Signed)
Patient ID: Stanley Moon, male   DOB: 11/25/1990, 25 y.o.   MRN: [id number]    Pt did go down to dinner he ate 75% of his meal, without having to be encouraged. This Clinical research associatewriter was in dining area and witness him eating.

## 2015-11-11 DIAGNOSIS — F411 Generalized anxiety disorder: Secondary | ICD-10-CM | POA: Diagnosis present

## 2015-11-11 DIAGNOSIS — F322 Major depressive disorder, single episode, severe without psychotic features: Principal | ICD-10-CM

## 2015-11-11 LAB — BASIC METABOLIC PANEL
ANION GAP: 5 (ref 5–15)
BUN: 12 mg/dL (ref 6–20)
CALCIUM: 9.7 mg/dL (ref 8.9–10.3)
CO2: 32 mmol/L (ref 22–32)
Chloride: 101 mmol/L (ref 101–111)
Creatinine, Ser: 0.87 mg/dL (ref 0.61–1.24)
Glucose, Bld: 84 mg/dL (ref 65–99)
Potassium: 3.8 mmol/L (ref 3.5–5.1)
SODIUM: 138 mmol/L (ref 135–145)

## 2015-11-11 LAB — PHOSPHORUS: Phosphorus: 4.2 mg/dL (ref 2.5–4.6)

## 2015-11-11 LAB — MAGNESIUM: MAGNESIUM: 2 mg/dL (ref 1.7–2.4)

## 2015-11-11 NOTE — Progress Notes (Signed)
Patient did not attend the evening speaker AA meeting. Pt was notified that group was beginning but remained in bed.   

## 2015-11-11 NOTE — Progress Notes (Signed)
[facility] MD Progress Note  11/11/2015 2:16 PM Stanley Moon  MRN:  [id number] Subjective: patient states: "I'm doing OK I guess. I guess they just have to watch me because they think I'm going to throw up."  Objective: Pt seen and chart reviewed. Pt is alert/oriented x4, calm, cooperative, and appropriate to situation. Pt denies suicidal/homicidal ideation and psychosis and does not appear to be responding to internal stimuli. Pt states that he has felt very depressed but feels that he is getting better overall and fdels that the treatment plan is helping.  Principal Problem: Major depressive disorder, single episode, severe without psychotic features ([place]) Diagnosis:   Patient Active Problem List   Diagnosis Date Noted  . GAD (generalized anxiety disorder) [F41.1] 11/11/2015    Priority: High  . Major depressive disorder, single episode, severe without psychotic features ([place]) [F32.2] 11/05/2015    Priority: High  . Anorexia nervosa [F50.00] 11/05/2015    Priority: High   Total Time spent with patient: 30 minutes  Past Psychiatric History: See above  Past Medical History:  Past Medical History:  Diagnosis Date  . Asthma    as a child, "I haven't used inhalers in years"   History reviewed. No pertinent surgical history. Family History: History reviewed. No pertinent family history. Family Psychiatric  History: See above Social History:  History  Alcohol Use No     History  Drug Use No    Social History   Social History  . Marital status: Single    Spouse name: N/A  . Number of children: N/A  . Years of education: N/A   Social History Main Topics  . Smoking status: Never Smoker  . Smokeless tobacco: Never Used  . Alcohol use No  . Drug use: No  . Sexual activity: Not Asked   Other Topics Concern  . None   Social History Narrative  . None   Additional Social History:    Pain Medications: Denies use Prescriptions: Denies use Over the Counter: Denies  abuse History of alcohol / drug use?: Yes Negative Consequences of Use:  (None reported) Withdrawal Symptoms: Other (Comment) (No history of withdrawal symptoms) Name of Substance 1: Marijuana 1 - Age of First Use: unknown 1 - Amount (size/oz): 1 gram or less 1 - Frequency: 1-2 times per week 1 - Duration: "few years" 1 - Last Use / Amount: 2 weeks ago                  Sleep: Fair  Appetite:  Poor- improving ( pt placed on 1:1 with meals)  Current Medications: Current Facility-Administered Medications  Medication Dose Route Frequency Provider Last Rate Last Dose  . acetaminophen (TYLENOL) tablet 650 mg  650 mg Oral Q6H PRN Patrecia Pour, NP      . alum & mag hydroxide-simeth (MAALOX/MYLANTA) 200-200-20 MG/5ML suspension 30 mL  30 mL Oral Q4H PRN Patrecia Pour, NP      . feeding supplement (ENSURE ENLIVE) (ENSURE ENLIVE) liquid 237 mL  237 mL Oral TID BM PRN Linard Millers, MD   237 mL at 11/11/15 1324  . hydrOXYzine (ATARAX/VISTARIL) tablet 25 mg  25 mg Oral TID PRN Patrecia Pour, NP   25 mg at 11/07/15 1857  . magnesium hydroxide (MILK OF MAGNESIA) suspension 30 mL  30 mL Oral Daily PRN Patrecia Pour, NP      . mirtazapine (REMERON) tablet 15 mg  15 mg Oral QHS Patrecia Pour, NP   15  mg at 11/10/15 2118  . ondansetron (ZOFRAN) tablet 4 mg  4 mg Oral Q8H PRN Patrecia Pour, NP      . thiamine (VITAMIN B-1) tablet 100 mg  100 mg Oral Daily Linard Millers, MD   100 mg at 11/11/15 4098    Lab Results:  Results for orders placed or performed during the hospital encounter of 11/05/15 (from the past 48 hour(s))  Magnesium     Status: None   Collection Time: 11/10/15  6:41 AM  Result Value Ref Range   Magnesium 2.0 1.7 - 2.4 mg/dL    Comment: Performed at [facility]  Phosphorus     Status: None   Collection Time: 11/10/15  6:41 AM  Result Value Ref Range   Phosphorus 3.6 2.5 - 4.6 mg/dL    Comment: Performed at [place] metabolic panel     Status: None   Collection Time: 11/10/15  6:41 AM  Result Value Ref Range   Sodium 137 135 - 145 mmol/L   Potassium 3.9 3.5 - 5.1 mmol/L   Chloride 101 101 - 111 mmol/L   CO2 30 22 - 32 mmol/L   Glucose, Bld 79 65 - 99 mg/dL   BUN 12 6 - 20 mg/dL   Creatinine, Ser 0.83 0.61 - 1.24 mg/dL   Calcium 9.7 8.9 - 10.3 mg/dL   GFR calc non Af Amer >60 >60 mL/min   GFR calc Af Amer >60 >60 mL/min    Comment: (NOTE) The eGFR has been calculated using the CKD EPI equation. This calculation has not been validated in all clinical situations. eGFR's persistently <60 mL/min signify possible Chronic Kidney Disease.    Anion gap 6 5 - 15    Comment: Performed at [facility]  Magnesium     Status: None   Collection Time: 11/11/15  6:33 AM  Result Value Ref Range   Magnesium 2.0 1.7 - 2.4 mg/dL    Comment: Performed at [facility]  Phosphorus     Status: None   Collection Time: 11/11/15  6:33 AM  Result Value Ref Range   Phosphorus 4.2 2.5 - 4.6 mg/dL    Comment: Performed at [place] metabolic panel     Status: None   Collection Time: 11/11/15  6:33 AM  Result Value Ref Range   Sodium 138 135 - 145 mmol/L   Potassium 3.8 3.5 - 5.1 mmol/L   Chloride 101 101 - 111 mmol/L   CO2 32 22 - 32 mmol/L   Glucose, Bld 84 65 - 99 mg/dL   BUN 12 6 - 20 mg/dL   Creatinine, Ser 0.87 0.61 - 1.24 mg/dL   Calcium 9.7 8.9 - 10.3 mg/dL   GFR calc non Af Amer >60 >60 mL/min   GFR calc Af Amer >60 >60 mL/min    Comment: (NOTE) The eGFR has been calculated using the CKD EPI equation. This calculation has not been validated in all clinical situations. eGFR's persistently <60 mL/min signify possible Chronic Kidney Disease.    Anion gap 5 5 - 15    Comment: Performed at [facility]    Blood Alcohol level:  Lab Results  Component Value Date   [facility] <5 11/91/4782    Metabolic  Disorder Labs: No results found for: HGBA1C, MPG No results found for: PROLACTIN No results found for: CHOL, TRIG, HDL, CHOLHDL, VLDL, LDLCALC  Physical Findings:  AIMS: Facial and Oral Movements Muscles of Facial Expression: None, normal Lips and Perioral Area: None, normal Jaw: None, normal Tongue: None, normal,Extremity Movements Upper (arms, wrists, hands, fingers): None, normal Lower (legs, knees, ankles, toes): None, normal, Trunk Movements Neck, shoulders, hips: None, normal, Overall Severity Severity of abnormal movements (highest score from questions above): None, normal Incapacitation due to abnormal movements: None, normal Patient's awareness of abnormal movements (rate only patient's report): No Awareness, Dental Status Current problems with teeth and/or dentures?: No Does patient usually wear dentures?: No  CIWA:    COWS:     Musculoskeletal: Strength & Muscle Tone: within normal limits and decreased Gait & Station: normal Patient leans: N/A  Psychiatric Specialty Exam: Physical Exam  Nursing note and vitals reviewed. Constitutional: He is oriented to person, place, and time. He appears well-developed.  HENT:  Head: Normocephalic.  Neurological: He is alert and oriented to person, place, and time.  Psychiatric: He has a normal mood and affect. His behavior is normal.    Review of Systems  Psychiatric/Behavioral: Positive for depression. Negative for hallucinations, substance abuse and suicidal ideas. The patient is nervous/anxious and has insomnia.   All other systems reviewed and are negative.   Blood pressure 98/76, pulse 76, temperature 98.1 F (36.7 C), resp. rate 16, height '5\' 10"'$  (1.778 m), weight 46.7 kg (103 lb), SpO2 100 %.Body mass index is 14.78 kg/m.  General Appearance: Casual and fairly groomed  Eye Contact:  Good  Speech:  Clear and Coherent  Volume:  Normal  Mood:  Depressed yet improving  Affect:  Appropriate  Thought Process:  Coherent   Orientation:  Full (Time, Place, and Person)  Thought Content:  Symptoms, worries, 1:1 status, food intake, concerns  Suicidal Thoughts:  No  Homicidal Thoughts:  No  Memory:  Immediate;   Fair Recent;   Fair Remote;   Fair  Judgement:  Fair  Insight:  Present  Psychomotor Activity:  Restlessness  Concentration:  Concentration: Fair  Recall:  Good  Fund of Knowledge:  Fair  Language:  Fair  Akathisia:  No  Handed:  Right  AIMS (if indicated):     Assets:  Communication Skills Desire for Improvement Resilience Talents/Skills  ADL's:  Intact  Cognition:  WNL  Sleep:  Number of Hours: 6.75   Treatment Plan Summary: Major depressive disorder, single episode, severe without psychotic features ([place]) improving, yet still unstable at this time. Concerns regarding food intake are still present requiring 1:1 but pt is improving some.   I have reviewed treatment plan below on 11/11/15 and will continue as follows:   CSW received message from Ennis Forts from [facility] for [facility] disorders Labs orders for BMP, magnesium and phosphorus -Continue with Remeron 15 mg for mood stabilization/ insomnia  Will continue to monitor vitals ,medication compliance and treatment side effects while patient is here.  Reviewed labs; chloride 99 decreased,BAL - , UDS -. CSW will start working on disposition.  Patient to participate in therapeutic milieu   Benjamine Mola, [place] 11/11/2015, 2:16 PM   Agree with NP Progress Note as above

## 2015-11-11 NOTE — Progress Notes (Signed)
1:1 Note Patient returned from cafeteria, sitting in dayroom with 1:1 present and was given one ensure.

## 2015-11-11 NOTE — Progress Notes (Signed)
1:1 Note: Patient sitting in dayroom with 1:1 present.  Patient was given 2 breakfast bars to eat.  Patient stated he would eat both.  Patient has refused sandwich, salad, crackers, etc.  Patient has drank 2 ensures since he did not eat any lunch.

## 2015-11-11 NOTE — Progress Notes (Signed)
1:1 present for lunch. Patient did not eat or drink any food/fluids.  Nurse took one ensure to patient in cafeteria while patient sat in cafeteria with visitor present and 1:1 present. Patient drank entire ensure.

## 2015-11-11 NOTE — Progress Notes (Signed)
D:  Patient's self inventory sheet, patient sleeps good, no sleep medication.  Good appetite, normal energy level, good concentration.  Denied depression, hopeless and anxiety.  Denied withdrawals.  Denied SI.  Denied physical problems.  Denied pain.  Goal is to discharge.  Plans to contact SW.  Does have discharge plans. A:  Medications administered per MD order. Emotional support and encouragement given patient. R:  Denied SI and HI, contracts for safety.  Denied A/V hallucinations.  Safety maintained with 15 minute checks.

## 2015-11-11 NOTE — Plan of Care (Signed)
Problem: Health Behavior/Discharge Planning: Goal: Compliance with treatment plan for underlying cause of condition will improve Discussed Behavior modification related to anorexia dx such as taking in smaller meals and increasing fluids. Pt receptive to teaching but still needs reinforcing.

## 2015-11-11 NOTE — Progress Notes (Signed)
Patient's mother Norlene [facility]uelZee [facility] home [phone], called and wants MD to call her concerning son's care.  Mother is concerned that patient is being weighed on different scales.  Patient will be here at visiting hours to introduce herself to staff.

## 2015-11-11 NOTE — Progress Notes (Signed)
1:1 Note Patient continues to sit in dayroom with 1:1 present.  Patient did eat his breakfast bar and has drank some of his ginger ale given to patient after breakfast.  Will continue to monitor patient per MD order.

## 2015-11-11 NOTE — Progress Notes (Signed)
Patient sitting in bed, declined food/drink.  Stated he is feeling OK and will tell staff if he wants food/drink.

## 2015-11-11 NOTE — Progress Notes (Signed)
1:1 Note  Patient sitting in dayroom with 1:1 present.  Patient stated he ate one biscuit and 4 pieces of bacon while in cafeteria.  Patient denied SI and HI, contracts for safety.  Patient denied A/V hallucinations.  Denied pain.  Patient was given breakfast bar and graham crackers to eat while sitting in dayroom with 1:1 present.  Patient took his morning vitamin without complaint.  Patient in good spirits and thanked nurse for her assistance.  Respirations even and unlabored.  No signs/symptoms of pain/distress noted on patient's face/body movements.  1:1 continues per MD order.

## 2015-11-11 NOTE — Progress Notes (Signed)
Patient declined food/drink this afternoon.  Stated he will eat at dinner.  Patient sitting in bed.  Respirations even and unlabored.  No signs/symptoms of pain/distress noted on patient's face/body movements.

## 2015-11-11 NOTE — [facility] Group Notes (Signed)
[facility] Group Notes: (Clinical Social Work)   11/11/2015      Type of Therapy:  Group Therapy   Participation Level:  Did Not Attend despite MHT prompting   Ambrose MantleMareida Grossman-Orr, LCSW 11/11/2015, 12:22 PM

## 2015-11-11 NOTE — Progress Notes (Signed)
1:1 Patient ate two vegetables and one fruit while 1:1 present in dining room.

## 2015-11-11 NOTE — Progress Notes (Signed)
1:1 Note Patient sitting in dayroom with 1:1 present.  Patient refused any drink/food at this time.  Stated he will ask staff for food/drink when he is ready to eat.

## 2015-11-11 NOTE — Progress Notes (Addendum)
1:1  Patient has 1:1 in his room.  Patient sitting on his bed with brother in room.  Patient declined food/drink.  Encouraged to tell staff when he wants food/drink.

## 2015-11-11 NOTE — Progress Notes (Signed)
1:1 Note:  Patient has been in his room, sitting in bed.  Patient denied SI and HI, contracts for safety.  Denied A/V hallucinations.  Denied pain.  Patient has been given fluids and offered snacks.  Patient will let staff know when he wants ensure, food.  1:1 discontinued after breakfast meal.

## 2015-11-11 NOTE — Progress Notes (Signed)
1:1 discontinued after dinner 1 hr watching patient.  Patient in his room with family present.

## 2015-11-11 NOTE — Progress Notes (Addendum)
1:1 Note: Patient has 1:1 and will be going to dining room for dinner.

## 2015-11-11 NOTE — Progress Notes (Signed)
1:1 note: 1215   Patient went to cafeteria for lunch with 1:1 present.

## 2015-11-11 NOTE — [facility] Group Notes (Signed)
The focus of this group is to educate the patient on the purpose and policies of crisis stabilization and provide a format to answer questions about their admission.  The group details unit policies and expectations of patients while admitted.  Patient did not attend 0900 nurse education orientation group this morning.  Patient stayed in bed.   

## 2015-11-11 NOTE — Progress Notes (Addendum)
1:1 Note Patient ate 2 packs of chocolate cookies and 3 packs of crackers.  Declined water, gatorade ade, ginger ale.   Patient refused all food offers and drink offers.  1:1 discontinued at this time.

## 2015-11-12 NOTE — Tx Team (Signed)
Interdisciplinary Treatment and Diagnostic Plan Update  11/12/2015 Time of Session: 9:30 AM Stanley Moon MRN: [id number]  Principal Diagnosis: Major depressive disorder, single episode, severe without psychotic features (HCC)  Secondary Diagnoses: Principal Problem:   Major depressive disorder, single episode, severe without psychotic features (HCC) Active Problems:   Anorexia nervosa   GAD (generalized anxiety disorder)   Current Medications:  Current Facility-Administered Medications  Medication Dose Route Frequency Provider Last Rate Last Dose  . acetaminophen (TYLENOL) tablet 650 mg  650 mg Oral Q6H PRN Charm RingsJamison Y Lord, NP      . alum & mag hydroxide-simeth (MAALOX/MYLANTA) 200-200-20 MG/5ML suspension 30 mL  30 mL Oral Q4H PRN Charm RingsJamison Y Lord, NP      . feeding supplement (ENSURE ENLIVE) (ENSURE ENLIVE) liquid 237 mL  237 mL Oral TID BM PRN Acquanetta SitElizabeth Woods Oates, MD   88.72 mL at 11/12/15 0739  . hydrOXYzine (ATARAX/VISTARIL) tablet 25 mg  25 mg Oral TID PRN Charm RingsJamison Y Lord, NP   25 mg at 11/07/15 1857  . magnesium hydroxide (MILK OF MAGNESIA) suspension 30 mL  30 mL Oral Daily PRN Charm RingsJamison Y Lord, NP      . mirtazapine (REMERON) tablet 15 mg  15 mg Oral QHS Charm RingsJamison Y Lord, NP   15 mg at 11/11/15 2136  . ondansetron (ZOFRAN) tablet 4 mg  4 mg Oral Q8H PRN Charm RingsJamison Y Lord, NP      . thiamine (VITAMIN B-1) tablet 100 mg  100 mg Oral Daily Acquanetta SitElizabeth Woods Oates, MD   100 mg at 11/12/15 0740    PTA Medications: Prescriptions Prior to Admission  Medication Sig Dispense Refill Last Dose  . doxycycline (VIBRA-TABS) 100 MG tablet Take 1 tablet (100 mg total) by mouth 2 (two) times daily. (Patient not taking: Reported on 11/04/2015) 28 tablet 0 Not Taking at Unknown time  . Multiple Vitamin (MULTIVITAMIN WITH MINERALS) TABS tablet Take 2 tablets by mouth at bedtime.       Treatment Modalities: Medication Management, Group therapy, Case management,  1 to 1 session with clinician,  Psychoeducation, Recreational therapy.   Physician Treatment Plan for Primary Diagnosis: Major depressive disorder, single episode, severe without psychotic features (HCC) Long Term Goal(s): Improvement in symptoms so as ready for discharge  Short Term Goals: Ability to identify changes in lifestyle to reduce recurrence of condition will improve and Ability to maintain clinical measurements within normal limits will improve  Medication Management: Evaluate patient's response, side effects, and tolerance of medication regimen.  Therapeutic Interventions: 1 to 1 sessions, Unit Group sessions and Medication administration.  Evaluation of Outcomes: Progressing  Physician Treatment Plan for Secondary Diagnosis: Principal Problem:   Major depressive disorder, single episode, severe without psychotic features (HCC) Active Problems:   Anorexia nervosa   GAD (generalized anxiety disorder)   Long Term Goal(s): Improvement in symptoms so as ready for discharge  Short Term Goals: Ability to identify changes in lifestyle to reduce recurrence of condition will improve, Ability to verbalize feelings will improve and Ability to identify and develop effective coping behaviors will improve  Medication Management: Evaluate patient's response, side effects, and tolerance of medication regimen.  Therapeutic Interventions: 1 to 1 sessions, Unit Group sessions and Medication administration.  Evaluation of Outcomes: Progressing   RN Treatment Plan for Primary Diagnosis: Major depressive disorder, single episode, severe without psychotic features (HCC) Long Term Goal(s): Knowledge of disease and therapeutic regimen to maintain health will improve  Short Term Goals: Ability to verbalize feelings will improve  and Ability to identify and develop effective coping behaviors will improve  Medication Management: RN will administer medications as ordered by provider, will assess and evaluate patient's response  and provide education to patient for prescribed medication. RN will report any adverse and/or side effects to prescribing provider.  Therapeutic Interventions: 1 on 1 counseling sessions, Psychoeducation, Medication administration, Evaluate responses to treatment, Monitor vital signs and CBGs as ordered, Perform/monitor CIWA, COWS, AIMS and Fall Risk screenings as ordered, Perform wound care treatments as ordered.  Evaluation of Outcomes: Progressing   LCSW Treatment Plan for Primary Diagnosis: Major depressive disorder, single episode, severe without psychotic features (HCC) Long Term Goal(s): Safe transition to appropriate next level of care at discharge, Engage patient in therapeutic group addressing interpersonal concerns.  Short Term Goals: Engage patient in aftercare planning with referrals and resources  Therapeutic Interventions: Assess for all discharge needs, 1 to 1 time with Social worker, Explore available resources and support systems, Assess for adequacy in community support network, Educate family and significant other(s) on suicide prevention, Complete Psychosocial Assessment, Interpersonal group therapy.  Evaluation of Outcomes: Progressing   Progress in Treatment: Attending groups: Yes Participating in groups: Yes Taking medication as prescribed: Yes, MD continues to assess for medication changes as needed Toleration medication: Yes, no side effects reported at this time Family/Significant other contact made:  Patient understands diagnosis:  Discussing patient identified problems/goals with staff: Yes Medical problems stabilized or resolved: Yes Denies suicidal/homicidal ideation:  Issues/concerns per patient self-inventory: None Other: N/A  New problem(s) identified: None identified at this time.   New Short Term/Long Term Goal(s): None identified at this time.   Discharge Plan or Barriers:  Patient continues to state he wants to follow up outpatient, MD concerned  about BMI, pt has been [facility] to [facility] for inpt tx, awaiting response.  Has appt w outpatient nutritionist, has also been seen by inpt nutrition consult, food log maintained.    Reason for Continuation of Hospitalization: Anxiety Delusions  Depression Hallucinations Homicidal ideation [place] Medical Issues Medication stabilization Suicidal ideation Withdrawal symptoms  Estimated Length of Stay: 3-5 days  Attendees: Patient:  Clemetine Marker 11/12/2015  10:36 AM  Physician: Richarda Overlie MD 11/12/2015  10:36 AM  Nursing: Elisha Headland RN 11/12/2015  10:36 AM  RN Care Manager:  11/12/2015  10:36 AM  Social Worker: Governor Rooks LCSW 11/12/2015  10:36 AM  Recreational Therapist:  11/12/2015  10:36 AM  Other:  11/12/2015  10:36 AM  Other:  11/12/2015  10:36 AM  Other: 11/12/2015  10:36 AM    Scribe for Treatment Team:  Santa Genera, LCSW Lead Clinical Social Worker Phone:  [phone]

## 2015-11-12 NOTE — Progress Notes (Signed)
Patient ID: Stanley Moon, male   DOB: 11/03/1990, 25 y.o.   MRN: [id number]  Patient ate two pieces of toast and 2 pieces of bacon for breakfast. He also drank 16 oz of water. Patient was on 1:1 during breakfast and is currently on 1:1 until an hour time is up.

## 2015-11-12 NOTE — Plan of Care (Signed)
Problem: Self-Concept: Goal: Level of anxiety will decrease Outcome: Not Progressing Pt has a poor eye contact, isolative to his room with little interaction with peers and staff.

## 2015-11-12 NOTE — [facility] Group Notes (Signed)
[facility]   11/12/2015 9:23 AM  Participation Quality: Did not stay for group, invited, did not participate  Maurilio LovelyMAnne C Lexie Morini

## 2015-11-12 NOTE — Progress Notes (Signed)
Patient ID: Stanley Moon, male   DOB: 10/31/1990, 25 y.o.   MRN: [id number]  DAR: Pt. Denies SI/HI and A/V Hallucinations. He reports sleep is good, appetite is good, energy level is normal, and concentration is good. He rates depression, hopelessness, and anxiety 0/10. Patient does not report any pain or discomfort at this time. Support and encouragement provided to the patient to come to writer with questions or concerns but does not. 1:1 maintained during meals and after for an hour. Patient is minimal with Clinical research associatewriter and has no initiation. He is seen in the dayroom tucking himself inside his shirt and sitting in the corner at times or laying in his bed. Orthostatic vitals retrieved. Patient is steady on his feet at this time. He continues to drink Ensure as a supplement and eating snacks. Intake is somewhat poor during meals. He states he prefers to drink Ensure instead of eating. For dinner he had carrots and fruit. No protein consumed therefore a whole Ensure was administered. Q15 minute checks are maintained for safety.

## 2015-11-12 NOTE — Progress Notes (Signed)
Psychoeducational Group Note  Date:  11/12/2015 Time:  2326  Group Topic/Focus:  Wrap-Up Group:   The focus of this group is to help patients review their daily goal of treatment and discuss progress on daily workbooks.   Participation Level: Did Not Attend  Participation Quality:  Not Applicable  Affect:  Not Applicable  Cognitive:  Not Applicable  Insight:  Not Applicable  Engagement in Group: Not Applicable  Additional Comments:  The patient did not attend group this evening despite being encouraged to do so. He elected to go back to sleep.    Farron Watrous S 11/12/2015, 11:26 PM

## 2015-11-12 NOTE — Progress Notes (Signed)
[facility] MD Progress Note  11/12/2015 2:21 PM  Patient Active Problem List   Diagnosis Date Noted  . GAD (generalized anxiety disorder) 11/11/2015  . Major depressive disorder, single episode, severe without psychotic features ([place]) 11/05/2015  . Anorexia nervosa 11/05/2015    Diagnosis: Anorexia nervosa, depression  Subjective: Stanley Moon denies any current suicidal or homicidal, ideation plan or intent. He reports that he had a visit from his family this weekend and it went well.  Objective: Well-developed cachectic male who is lying in bed with the lights off, he is pleasant and appropriate. Speech and motor appear within normal limits. Thought processes are linear and goal-directed thought content denies any psychosis homicidal ideation or suicidal ideation. Alert and oriented 3 insight and judgment are limited IQ appears average range  Reviewed labs they remain in normal limits phosphorus has decreased to 3.6 from admission of 4.4 however this is still seems to be within normal limits and there are no reports of any refeeding syndrome symptoms or any complaints of severe refeeding syndrome symptoms. Weight remains stable but on improved at 46.7 kg or 103 pounds review of nursing notes that were completed indicates patient is probably eating about 800-900 kcal a day in in the cafeteria with possibly also some Ensure supplementation as well.  Spoke with patient's mother to provide her an update today.         Current Facility-Administered Medications (Analgesics):  .  acetaminophen (TYLENOL) tablet 650 mg     Current Facility-Administered Medications (Other):  .  alum & mag hydroxide-simeth (MAALOX/MYLANTA) 200-200-20 MG/5ML suspension 30 mL .  feeding supplement (ENSURE ENLIVE) (ENSURE ENLIVE) liquid 237 mL .  hydrOXYzine (ATARAX/VISTARIL) tablet 25 mg .  magnesium hydroxide (MILK OF MAGNESIA) suspension 30 mL .  mirtazapine (REMERON) tablet 15 mg .  ondansetron (ZOFRAN) tablet  4 mg .  thiamine (VITAMIN B-1) tablet 100 mg  No current outpatient prescriptions on file.  Vital Signs:Blood pressure (!) 93/55, pulse (!) 55, temperature 97.6 F (36.4 C), temperature source Oral, resp. rate 16, height _0  (1.778 m), weight 46.7 kg (103 lb), SpO2 100 %.    Lab Results:  Results for orders placed or performed during the hospital encounter of 11/05/15 (from the past 48 hour(s))  Magnesium     Status: None   Collection Time: 11/11/15  6:33 AM  Result Value Ref Range   Magnesium 2.0 1.7 - 2.4 mg/dL    Comment: Performed at [facility]  Phosphorus     Status: None   Collection Time: 11/11/15  6:33 AM  Result Value Ref Range   Phosphorus 4.2 2.5 - 4.6 mg/dL    Comment: Performed at [facility] metabolic panel     Status: None   Collection Time: 11/11/15  6:33 AM  Result Value Ref Range   Sodium 138 135 - 145 mmol/L   Potassium 3.8 3.5 - 5.1 mmol/L   Chloride 101 101 - 111 mmol/L   CO2 32 22 - 32 mmol/L   Glucose, Bld 84 65 - 99 mg/dL   BUN 12 6 - 20 mg/dL   Creatinine, Ser 0.87 0.61 - 1.24 mg/dL   Calcium 9.7 8.9 - 10.3 mg/dL   GFR calc non Af Amer >60 >60 mL/min   GFR calc Af Amer >60 >60 mL/min    Comment: (NOTE) The eGFR has been calculated using the CKD EPI equation. This calculation has not been validated in all clinical situations. eGFR's persistently <  60 mL/min signify possible Chronic Kidney Disease.    Anion gap 5 5 - 15    Comment: Performed at [facility]    Physical Findings: AIMS: Facial and Oral Movements Muscles of Facial Expression: None, normal Lips and Perioral Area: None, normal Jaw: None, normal Tongue: None, normal,Extremity Movements Upper (arms, wrists, hands, fingers): None, normal Lower (legs, knees, ankles, toes): None, normal, Trunk Movements Neck, shoulders, hips: None, normal, Overall Severity Severity of abnormal movements (highest score from questions  above): None, normal Incapacitation due to abnormal movements: None, normal Patient's awareness of abnormal movements (rate only patient's report): No Awareness, Dental Status Current problems with teeth and/or dentures?: No Does patient usually wear dentures?: No  CIWA:  CIWA-Ar Total: 1 COWS:  COWS Total Score: 1   Assessment/Plan: We are exploring follow-up options for Stanley Moon. Social work hopes to hear back from a couple of programs today. It might be that he could benefit from going to the partial hospitalization program. It appears that although his weight is stable he is still restricting here and is not making progress in this environment. We will continue to monitor his treatment. He is currently compliant with Remeron which is an improvement. He does continue to isolate and stay in his room however.  Linard Millers, MD 11/12/2015, 2:21 PM

## 2015-11-12 NOTE — Progress Notes (Addendum)
Patient ID: Stanley Moon, male   DOB: 05/08/1990, 25 y.o.   MRN: [id number]  Patient did not eat lunch in the cafeteria per MHT Macdilla, patient also stated he did not. He was willing to drink 12 oz of Ensure and received it. He drank it in front of Clinical research associatewriter. Patient was instructed to sit in the dayroom for an hour with MHT within reach. Patient complied with this instruction. Patient then ate 2 nutrigrain bars, 1 bag of pretzels, 1 small bag of lorna doone cookies, and drank 16 oz of water. Patient and MHT were instructed to stay in dayroom for an extra 15 minutes due to patient eating these snacks.

## 2015-11-12 NOTE — Progress Notes (Signed)
Recreation Therapy Notes  Date: 11/12/15 Time: 0930 Location: 300 Hall Group Room  Group Topic: Stress Management  Goal Area(s) Addresses:  Patient will verbalize importance of using healthy stress management.  Patient will identify positive emotions associated with healthy stress management.   Intervention: Guided Imagery  Activity :  Depression Imagery.  LRT introduced the technique of guided imagery to the patients.  LRT read a script allowing patients to participate in the activity.  Patients were to follow along as LRT read script.  Education:  Stress Management, Discharge Planning.   Education Outcome: Acknowledges edcuation/In group clarification offered/Needs additional education  Clinical Observations/Feedback: Pt did not attend group.     Herron Fero, LRT/CTRS         Abdul Beirne A 11/12/2015 11:56 AM 

## 2015-11-12 NOTE — Progress Notes (Signed)
NUTRITION FOLLOW-UP  INTERVENTION: 1. Recommend transfer to inpatient treatment facility for eating disorders 2. Discussed supplement order and meal completion amounts with RN. 3. Supplements: Ensure Enlive po TID, each supplement provides 350 kcal and 20 grams of protein 4. Each meal should consist of a protein, starch, vegetable, fruit and dairy.  (Based on meal completion: Provide 12 oz if PO intake is 0-24%, 9 oz -25%, 6 oz -50%, 75-99% -3 oz.) 5. Recommend monitor patient 1-2 hours after meals to observe any exercising/purging. 6. Monitor magnesium, potassium, and phosphorus daily for at least 3 days as pt is at risk for refeeding syndrome given poor PO intake x 9 months PTA. 7. Reviewed plan with RN and requested meal and supplement completion to be documented in chart.  8. Please consult/page for additional needs.  NUTRITION DIAGNOSIS: Unintentional weight loss related to sub-optimal intake as evidenced by pt report.   Goal: Pt to meet >/= 90% of their estimated nutrition needs.  Monitor:  PO intake  Assessment:  Patient continues with inconsistent intakes. RN with questions regarding supplement order and meal completion amounts. Per RN, pt did not consume any lunch, just sat with his head down. Pt returned from lunch and consumed all 12 oz of an Ensure supplement then continued to eat snacks. Monitoring after meals is continuing a well as 1:1 monitoring at mealtimes.  Labs reviewed: Mg/Phos/K WNL. Vitamin B-12 WNL.  Patient's weight is up to 103 lb.   Height: Ht Readings from Last 1 Encounters:  11/07/15 5\' 10"  (1.778 m)    Weight: Wt Readings from Last 1 Encounters:  11/12/15 103 lb (46.7 kg)    Weight Hx: Wt Readings from Last 10 Encounters:  11/12/15 103 lb (46.7 kg)  08/29/13 135 lb (61.2 kg)    BMI:  Body mass index is 14.78 kg/m. Pt meets criteria for underweight based on current BMI.  Estimated Nutritional Needs: Kcal: 25-30 kcal/kg Protein: > 1 gram  protein/kg Fluid: 1 ml/kcal  Diet Order: Diet Heart Room service appropriate? Yes; Fluid consistency: Thin Pt is also offered choice of unit snacks mid-morning and mid-afternoon.  Pt is eating as desired.   Lab results and medications reviewed.   Tilda FrancoLindsey Johnita Palleschi, MS, RD, LDN Pager: [phone] After Hours Pager: [phone]

## 2015-11-13 LAB — BASIC METABOLIC PANEL
ANION GAP: 8 (ref 5–15)
BUN: 14 mg/dL (ref 6–20)
CHLORIDE: 99 mmol/L — AB (ref 101–111)
CO2: 31 mmol/L (ref 22–32)
Calcium: 10 mg/dL (ref 8.9–10.3)
Creatinine, Ser: 0.91 mg/dL (ref 0.61–1.24)
GFR calc Af Amer: >60 mL/min (ref >60)
GFR calc non Af Amer: >60 mL/min (ref >60)
GLUCOSE: 102 mg/dL — AB (ref 65–99)
POTASSIUM: 3.7 mmol/L (ref 3.5–5.1)
Sodium: 138 mmol/L (ref 135–145)

## 2015-11-13 LAB — PHOSPHORUS: Phosphorus: 3.7 mg/dL (ref 2.5–4.6)

## 2015-11-13 LAB — MAGNESIUM: Magnesium: 2.2 mg/dL (ref 1.7–2.4)

## 2015-11-13 NOTE — Progress Notes (Signed)
Patient did not attend AA group meeting tonight.  

## 2015-11-13 NOTE — BHH Group Notes (Signed)
B[facility] Group Notes:  (Nursing/MHT/Case Management/Adjunct)  Date:  11/13/2015  Time:  9:47 AM Type of Therapy:  Psychoeducational Skills  Participation Level:  Did Not Attend  Participation Quality:  DID NOT ATTEND  Affect:  DID NOT ATTEND  Cognitive:  DID NOT ATTEND  Insight:  None  Engagement in Group:  DID NOT ATTEND  Modes of Intervention:  DID NOT ATTEND  Summary of Progress/Problems: Pt did not attend patient self inventory group.   Bethann PunchesJane O Laiba Fuerte 11/13/2015, 9:47 AM

## 2015-11-13 NOTE — Progress Notes (Signed)
DAR NOTE: Pt present with flat affect and depressed mood in the unit. Pt has been isolating himself and has in been bed most of the time. Pt denies physical pain, took  his meds as scheduled. As per self inventory, pt had a good night sleep, good appetite, normal energy, and good concentration. Pt rate depression at 0, hopeless ness at 0, anxiety at 0. Pt's safety ensured with 15 minute and environmental checks. Pt currently denies SI/HI and A/V hallucinations. Pt verbally agrees to seek staff if SI/HI or A/VH occurs and to consult with staff before acting on these thoughts. Will continue POC.

## 2015-11-13 NOTE — Progress Notes (Signed)
D: Pt is alert an oriented x4. Pt at the time of assessment denied depression, anxiety, pain, SI, HI or AVH; states, "I am doing just fine." Pt was very quick to deny; says "NO" even before the questions were asked. Pt is however is flat and remained in his room all evening. Pt remained calm and cooperative. A: Medications offered as prescribed.  Support, encouragement, and safe environment provided.  15-minute safety checks continue. R: Pt was med compliant.  Pt did not attend wrap-up group. Safety checks continue.

## 2015-11-13 NOTE — Progress Notes (Signed)
Pt ate 4 pieces pf bacon and 1 biscuit for breakfast, pt drunk 16 oz of water. Pt observed 1:1 during breakfast and 1 hr after that.

## 2015-11-13 NOTE — Progress Notes (Signed)
Pt did not eat lunch in the cafeteria as reported by MHT Jaquisha and pt himself. Pt did not drink any fluids either. Pt was offered ensure thereafter of which pt drunk 16 oz in front of the writer. Pt went to the dayroom after that where staff sat with him 1:1 for 1 hr.

## 2015-11-13 NOTE — Progress Notes (Addendum)
PHP declined patient at this time due to not accepting patient without insurance and current treatment while on unit.  LCSW and MD discussed options for patient: [facility]. LCSW emailed partial in effort to see if patient qualifies and meets criteria.   Will follow up.  Deretha EmoryHannah Kashlynn Kundert LCSW, MSW Clinical Social Work: Optician, dispensingystem Wide Float Coverage for :  [employer]

## 2015-11-13 NOTE — Clinical Social Work Note (Signed)
Call [facility] - states they do not have the capacity to treat adult males on their inpatient unit.  [facility] [employer] [facility] [place] which [facility] a free [facility] unit as well as [facility] [employer] [facility] [place] which has funds available for scholarships in January 2018.  Also states that [facility] in [place] may be opening inpt treatment program for males soon.    Santa GeneraAnne Jamesina Gaugh, LCSW Lead Clinical Social Worker Phone:  [phone]

## 2015-11-13 NOTE — [facility] Group Notes (Addendum)
Type of Therapy: Mental Health Association Presentation  Participation Level: Disengaged,   Participation Quality: Poor  (head in shirt, talking to self, left early)  Affect: Incongruent  Cognitive: unknown, patient did not participate  Insight: limited  Engagement in Therapy: Poor  Modes of Intervention: Discussion, Education and Socialization  Summary of Progress/Problems: Mental Health Association (MHA) Speaker came to talk about his personal journey with substance abuse and addiction. The pt processed ways by which to relate to the speaker. MHA speaker provided handouts and educational information pertaining to groups and services offered by the [facility]. Pt was engaged in speaker's presentation and was receptive to resources provided.  Stanley EmoryHannah Omarr Hann LCSW, MSW Clinical Social Work: Optician, dispensingystem Wide Float

## 2015-11-13 NOTE — Progress Notes (Signed)
[facility] MD Progress Note  11/13/2015 10:58 AM  Patient Active Problem List   Diagnosis Date Noted  . GAD (generalized anxiety disorder) 11/11/2015  . Major depressive disorder, single episode, severe without psychotic features (HCC) 11/05/2015  . Anorexia nervosa 11/05/2015    Diagnosis: Depression, anorexia nervosa  Subjective: Patient states his mood is "well" and he is "not discouraged" it was discussed with patient that he still continues to isolate somewhat and he reports that he feels he was only here for medication and nutrition as far as the groups go it was never nothing I was interested in." He was encouraged to join the groups more frequently in order to possibly derive benefit he is not expecting him to increase his socialization.  Objective: Well developed detected male lying in bed in no apparent distress pleasant and appropriate speech and motor appear within normal limits thought processes are linear and goal-directed thought content denies any suicidal or homicidal ideation, plan or intent mood is described as well and affect appears euthymic but slightly withdrawn and anxious alert and oriented 3 insight is limited judgment is fair IQ appears an average range  Chart reviewed it appears that patient is accepting the Ensure supplements and in fact states he prefers them to eating the food. Intake reminded Main's variable it appears however on 10/9 in the evening he did eat two Nutri-Grain bars one small snack bag of pretzels and 1 small bag of Lorna Dune cookies as well as 16 ounces of water along with 12 ounces of Ensure in the evening.  He has been compliant with his Remeron  Weight appears to be remaining stable. There appear to been no dramatic shifts in weight or fluid retention at present.         Current Facility-Administered Medications (Analgesics):  .  acetaminophen (TYLENOL) tablet 650 mg     Current Facility-Administered Medications (Other):  .  alum &  mag hydroxide-simeth (MAALOX/MYLANTA) 200-200-20 MG/5ML suspension 30 mL .  feeding supplement (ENSURE ENLIVE) (ENSURE ENLIVE) liquid 237 mL .  hydrOXYzine (ATARAX/VISTARIL) tablet 25 mg .  magnesium hydroxide (MILK OF MAGNESIA) suspension 30 mL .  mirtazapine (REMERON) tablet 15 mg .  ondansetron (ZOFRAN) tablet 4 mg  No current outpatient prescriptions on file.  Vital Signs:Blood pressure 110/74, pulse 66, temperature 97.6 F (36.4 C), temperature source Oral, resp. rate 16, height 5\' 10"  (1.778 m), weight 46.7 kg (103 lb), SpO2 100 %.    Lab Results: No results found for this or any previous visit (from the past 48 hour(s)).  Physical Findings: AIMS: Facial and Oral Movements Muscles of Facial Expression: None, normal Lips and Perioral Area: None, normal Jaw: None, normal Tongue: None, normal,Extremity Movements Upper (arms, wrists, hands, fingers): None, normal Lower (legs, knees, ankles, toes): None, normal, Trunk Movements Neck, shoulders, hips: None, normal, Overall Severity Severity of abnormal movements (highest score from questions above): None, normal Incapacitation due to abnormal movements: None, normal Patient's awareness of abnormal movements (rate only patient's report): No Awareness, Dental Status Current problems with teeth and/or dentures?: No Does patient usually wear dentures?: No  CIWA:  CIWA-Ar Total: 1 COWS:  COWS Total Score: 1   Assessment/Plan: Patient with depression and anorexia nervosa. He is eating more variety of foods than he did at home and at times he has accepted snacks and he appears to be accepting Ensure although overall he is still doing some restricting. He continues to be isolative in his room and wearing his paper scrubs rather  than putting on clothing. He was encouraged to give socializing and the groups more of a try today. We will recheck a phosphorus, Chem-7 and magnesium tomorrow morning. Social work has heard back from [facility] program in [place] they do not have any funds for scholarships available at present. It appears that [facility] may not be available. There is a program that excepts people [place] insurance in [place] associated with [employer] according to the [facility] program area is unknown whether the patient would be interested in pursuing something in this location. We will continue to talk with his mother about future planning. Options might include a scholarship to our partial hospitalization program if he has not aged out of it.  Acquanetta SitElizabeth Woods Oberon Hehir, MD 11/13/2015, 10:58 AM

## 2015-11-13 NOTE — Progress Notes (Signed)
Recreation Therapy Notes  Animal-Assisted Activity (AAA) Program Checklist/Progress Notes Patient Eligibility Criteria Checklist & Daily Group note for Rec TxIntervention  Date: 10.10.2017 Time: 3:00pm [address]: [address]    AAA/T Program Assumption of Risk Form signed by Patient/ or Parent Legal Guardian Yes  Patient is free of allergies or sever asthma Yes  Patient reports no fear of animals Yes  Patient reports no history of cruelty to animals Yes  Patient understands his/her participation is voluntary Yes  Clinical Observations/Feedback: Patient declined services at admission. Decline noted on consent form in paper chart.   Marykay Lexenise L Adaya Garmany, LRT/CTRS        Harshan Kearley L 11/13/2015 3:08 PM

## 2015-11-13 NOTE — Progress Notes (Signed)
Pt ate 50% of dinner ( rice and broccoli), however did pt not drink any fluids with his dinner. Pt was offered 6 oz of ensure which he drunk in front of the Clinical research associatewriter. Pt instructed to seat in the day room, where staff stayed which him 1:1 for one hr after dinner.

## 2015-11-13 NOTE — Progress Notes (Signed)
D    Pt was pleasant and cooperative    He isolated to his to his room and did not attend group and does not interact with others   He seems to be a little brighter and talks a little more A    Verbal support given    Medications administered and effectiveness monitored    Q 15 min checks   Offered ensure and snacks to pt  R    Pt is safe at present  Pt refused snacks and ensure saying he was not hungry

## 2015-11-14 MED ORDER — OLANZAPINE 5 MG PO TBDP
5.0000 mg | ORAL_TABLET | Freq: Every day | ORAL | Status: DC
  Administered 2015-11-14: 5 mg via ORAL
  Filled 2015-11-14 (×2): qty 1

## 2015-11-14 NOTE — Progress Notes (Signed)
Pt ate 50% of his lunch, 1 cone on a cob, fruits and vegetables and 16 oz of water in the cafeteria. Pt offered 6 oz of ensure back in the unit and drunk in the front of the writer. Pt was then instructed to stayed in the dayroom with staff monitoring 1:1 for one hr.

## 2015-11-14 NOTE — Progress Notes (Signed)
[facility] MD Progress Note  11/14/2015 8:53 AM  Patient Active Problem List   Diagnosis Date Noted  . GAD (generalized anxiety disorder) 11/11/2015  . Major depressive disorder, single episode, severe without psychotic features ([place]) 11/05/2015  . Anorexia nervosa 11/05/2015    Diagnosis: Subjective: Patient denies any acute concerns and he denies any suicidal or homicidal ideation, plan or intent. He is still wearing the hospital paper scrubs and he reports that he feels that these are appropriate for him to wear while he is a patient. He was offered positive feedback for sitting in group yesterday and it was suggested that he begin working up to uncovering his face and participating.   Objective: Well-developed cachectic male in no apparent distress pleasant and appropriate ambulating in the halls speech and motor appear within normal limits mood is described as alright and affect is cheerful today thought processes are linear and goal-directed thought content denies any suicidal or homicidal ideation, plan or intent or any thoughts of self-harm or psychosis alert and oriented 3 insight and judgment are limited IQ appears an average range   Weight appears stable at 46.7 kg         Current Facility-Administered Medications (Analgesics):  .  acetaminophen (TYLENOL) tablet 650 mg     Current Facility-Administered Medications (Other):  .  alum & mag hydroxide-simeth (MAALOX/MYLANTA) 200-200-20 MG/5ML suspension 30 mL .  feeding supplement (ENSURE ENLIVE) (ENSURE ENLIVE) liquid 237 mL .  hydrOXYzine (ATARAX/VISTARIL) tablet 25 mg .  magnesium hydroxide (MILK OF MAGNESIA) suspension 30 mL .  mirtazapine (REMERON) tablet 15 mg .  ondansetron (ZOFRAN) tablet 4 mg  No current outpatient prescriptions on file.  Vital Signs:Blood pressure 115/80, pulse 62, temperature 97.6 F (36.4 C), temperature source Oral, resp. rate 16, height '5\' 10"'$  (1.778 m), weight 46.7 kg (103 lb), SpO2 100  %.    Lab Results:  Results for orders placed or performed during the hospital encounter of 11/05/15 (from the past 48 hour(s))  Phosphorus     Status: None   Collection Time: 11/13/15  6:28 PM  Result Value Ref Range   Phosphorus 3.7 2.5 - 4.6 mg/dL    Comment: Performed at [facility]  Magnesium     Status: None   Collection Time: 11/13/15  6:28 PM  Result Value Ref Range   Magnesium 2.2 1.7 - 2.4 mg/dL    Comment: Performed at [place] metabolic panel     Status: Abnormal   Collection Time: 11/13/15  6:28 PM  Result Value Ref Range   Sodium 138 135 - 145 mmol/L   Potassium 3.7 3.5 - 5.1 mmol/L   Chloride 99 (L) 101 - 111 mmol/L   CO2 31 22 - 32 mmol/L   Glucose, Bld 102 (H) 65 - 99 mg/dL   BUN 14 6 - 20 mg/dL   Creatinine, Ser 0.91 0.61 - 1.24 mg/dL   Calcium 10.0 8.9 - 10.3 mg/dL   GFR calc non Af Amer >60 >60 mL/min   GFR calc Af Amer >60 >60 mL/min    Comment: (NOTE) The eGFR has been calculated using the CKD EPI equation. This calculation has not been validated in all clinical situations. eGFR's persistently <60 mL/min signify possible Chronic Kidney Disease.    Anion gap 8 5 - 15    Comment: Performed at [facility]    Physical Findings: AIMS: Facial and Oral Movements Muscles of Facial Expression: None, normal Lips and  Perioral Area: None, normal Jaw: None, normal Tongue: None, normal,Extremity Movements Upper (arms, wrists, hands, fingers): None, normal Lower (legs, knees, ankles, toes): None, normal, Trunk Movements Neck, shoulders, hips: None, normal, Overall Severity Severity of abnormal movements (highest score from questions above): None, normal Incapacitation due to abnormal movements: None, normal Patient's awareness of abnormal movements (rate only patient's report): No Awareness, Dental Status Current problems with teeth and/or dentures?: No Does patient usually wear dentures?: No   CIWA:  CIWA-Ar Total: 1 COWS:  COWS Total Score: 1   Assessment/Plan: Patient with depression and anorexia nervosa he has been consuming some calories while he is here but not enough to have weight restoration. We continue to explore options for arranging further follow-up that would be appropriate given his cachectic state. So far laboratory studies have remained within normal limits without evidence of bradycardia, acute electrolyte abnormalities or refeeding syndrome.  Linard Millers, MD 11/14/2015, 8:53 AM

## 2015-11-14 NOTE — Progress Notes (Signed)
Pt ate 2 pieces of bacon and 2 slices of bread of his breakfast ( 50%). Pt was offered 6 oz of ensure, pt also drank 8 oz of water. Pt monitored 1:1 during this time. Will continue to monitor.

## 2015-11-14 NOTE — Progress Notes (Signed)
Recreation Therapy Notes  Date: 11/14/15 Time: 0930 Location: [address] Dayroom  Group Topic: Stress Management  Goal Area(s) Addresses:  Patient will verbalize importance of using healthy stress management.  Patient will identify positive emotions associated with healthy stress management.   Intervention: Stress Management  Activity :  Progressive Muscle Relaxation.  LRT introduced the technique of progressive muscle relaxation.  LRT explained that the technique allows patients to relax muscle groups one at a time.  LRT read a script to guide the patients through the technique.  Pt were to follow along as LRT read script to engage in technique.  Education:  Stress Management, Discharge Planning.   Education Outcome: Acknowledges edcuation/In group clarification offered/Needs additional education  Clinical Observations/Feedback: Pt did not attend group.    Gabrielle Mester, LRT/CTRS         Adryel Wortmann A 11/14/2015 12:56 PM 

## 2015-11-14 NOTE — [facility] Group Notes (Signed)
[facility] LCSW Group Therapy  11/14/2015 1:47 PM  Type of Therapy:  Group Therapy  Participation Level:  None   Patient in group room. Spoke with patient, but he refused to engage in group.  Remains with his shirt over his head and arms pulled into shirt. Remains on 1:1.  Eyes are rolling,jerky eye movements, irritated with staff and showing defiance. Reports he will go back to his room.     Raye SorrowCoble, Brewer Hitchman N 11/14/2015, 1:47 PM

## 2015-11-14 NOTE — Progress Notes (Signed)
Pt ate all of his bacon and a slice of toast for breakfast. Pt then drank an ensure. Stayed with pt 1:1 for an hour following his breakfast. He stayed in the dayroom with his head covered by his scrubs. Safety maintained on the unit.

## 2015-11-14 NOTE — Progress Notes (Signed)
Plan for patient at this time is to [facility] in clinic at 8am. Patient has a nutrition appointment at 3pm on 10/12. Patient will DC to mother's care and complete follow up.  MD is aware and mother is agreeable.  Deretha EmoryHannah Adrina Armijo LCSW, MSW Clinical Social Work: Optician, dispensingystem [employer] [place]

## 2015-11-14 NOTE — Progress Notes (Signed)
Patient did not attend NA group meeting tonight.  

## 2015-11-14 NOTE — Progress Notes (Signed)
DAR NOTE: Patient presents with anxious affect and depressed mood.  Denies pain, auditory and visual hallucinations.  Rates depression at 0, hopelessness at 0, and anxiety at 0.  Denied SI/HI and verbally contracted for safety. Maintained on routine safety checks. Support and encouragement offered as needed.  Offered no complaint.

## 2015-11-14 NOTE — Progress Notes (Signed)
D    Pt was pleasant and cooperative    He isolated to his to his room and did not attend group and does not interact with others   He seems to be a little brighter and talks a little more  Pt was compliant with new medications this evening A    Verbal support given    Medications administered and effectiveness monitored    Q 15 min checks   Offered ensure and snacks to pt  R    Pt is safe at present  Pt refused snacks and ensure saying he was not hungry

## 2015-11-14 NOTE — Progress Notes (Signed)
Patient observed to be thought blocking, rapid speech, covers his head with his shirt.  Per patient, he states that he will refuse to eat and go to groups.  Patient was also observed in the corner of the day room mumbling.  Maintains little to no eye contact.  In the cafeteria, patient puts shirt over his head.  Per treatment team to include Dr Lucianne MussKumar, orders given to order Zyprexa Zydis.  Will continue to monitor patient.

## 2015-11-14 NOTE — Progress Notes (Signed)
Pt took 8 bites of vegetables  during dinner  And drunk 16 oz of water. Pt was also given 8 oz of ensure. Pt monitored 1:1 during meals and 1 hr after.

## 2015-11-15 ENCOUNTER — Encounter: Payer: Self-pay | Attending: "Endocrinology | Admitting: *Deleted

## 2015-11-15 ENCOUNTER — Encounter: Payer: Self-pay | Admitting: *Deleted

## 2015-11-15 DIAGNOSIS — Z029 Encounter for administrative examinations, unspecified: Secondary | ICD-10-CM | POA: Insufficient documentation

## 2015-11-15 DIAGNOSIS — E43 Unspecified severe protein-calorie malnutrition: Secondary | ICD-10-CM

## 2015-11-15 MED ORDER — OLANZAPINE 5 MG PO TBDP: 5.0000 mg | ORAL_TABLET | Freq: Every day | ORAL | 0 refills | Status: DC

## 2015-11-15 MED ORDER — HYDROXYZINE HCL 25 MG PO TABS: 25.0000 mg | ORAL_TABLET | Freq: Three times a day (TID) | ORAL | 0 refills | Status: DC | PRN

## 2015-11-15 MED ORDER — OLANZAPINE 5 MG PO TBDP
5.0000 mg | ORAL_TABLET | Freq: Every day | ORAL | Status: DC
  Filled 2015-11-15: qty 1
  Filled 2015-11-15: qty 7

## 2015-11-15 MED ORDER — MIRTAZAPINE 15 MG PO TABS: 15.0000 mg | ORAL_TABLET | Freq: Every day | ORAL | 0 refills | Status: DC

## 2015-11-15 NOTE — [facility] Suicide Risk Assessment (Signed)
[facility]   Principal Problem: Major depressive disorder, single episode, severe without psychotic features [facility](HCC) Discharge Diagnoses:  Patient Active Problem List   Diagnosis Date Noted  . GAD (generalized anxiety disorder) [F41.1] 11/11/2015  . 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 Anorexia nervosa [F50.00] 11/05/2015    Total Time spent with patient: 15 minutes  Musculoskeletal: Strength & Muscle Tone: within normal limits Gait & Station: normal Patient leans: n/a  Psychiatric Specialty Exam: ROS  Blood pressure 109/73, pulse 74, temperature 97.6 F (36.4 C), resp. rate 16, height 5\' 10"  (1.778 m), weight 47.2 kg (104 lb), SpO2 100 %.Body mass index is 14.92 kg/m.  General Appearance: catechic and wearing hospital scrubs  Eye Contact::  Fair  Speech:  Clear and Coherent  Volume:  Normal  Mood:  Euthymic  Affect:  Congruent  Thought Process:  Goal Directed  Orientation:  Full (Time, Place, and Person)  Thought Content:  Negative  Suicidal Thoughts:  No  Homicidal Thoughts:  No  Memory:  Negative  Judgement:  Other:  limited on certain topics  Insight:  Shallow  Psychomotor Activity:  Negative  Concentration:  Good  Recall:  Good  Fund of Knowledge:Good  Language: Good  Akathisia:  No  Handed:  Right  AIMS (if indicated):     Assets:  Housing Social Support  Sleep:  Number of Hours: 6.25  Cognition: WNL  ADL's:  Intact   Mental Status Per Nursing Assessment::   On Admission:  NA  Demographic Factors:  Male and Unemployed  Loss Factors: Decrease in vocational status and Financial problems/change in socioeconomic status  Historical Factors: NA  Risk Reduction Factors:   Living with another person, especially a relative and Positive social support  Continued Clinical Symptoms:  Anorexia Nervosa Depression:   Anhedonia  Cognitive Features That Contribute  To Risk:  None    Suicide Risk:  Mild:  Suicidal ideation of limited frequency, intensity, duration, and specificity.  There are no identifiable plans, no associated intent, mild dysphoria and related symptoms, good self-control (both objective and subjective assessment), few other risk factors, and identifiable protective factors, including available and accessible social support.  Follow-up Information    [facility] Follow up on 11/15/2015.   Why:  Initial appointment w nutritionist on 10/12 at 3 PM.  Please call to cancel/reschedule if needed.   Contact information: [address],  [place], [place] [address] Phone: [phone] Fax:  PROVIDER HAS ACCESS TO EPIC        [facility] Follow up on 11/16/2015.   Specialty:  Behavioral Health Why:  Appointment for walk in for assessment of needs. Arrive any time between 8am-3pm. Arrive early if possible to decrease your wait time. Contact information: [address] [place] [address] [phone]           Plan Of Care/Follow-up recommendations:   Other:  The patient denies any acute suicidal or homicidal ideation, plan or intent. He denies any plans for any self-injurious behavior or any self-injurious thoughts. The patient continues to exhibit some isolation and his participation in groups has been minimal. Is recommended that he continue with treatment for depression including taking his Remeron and attending his mental health appointments. The patient has anorexia nervosa and while on the ward he has continued to engage in restricting behavior. Currently his lab work and vital signs are stable indicating that he is  not acutely at risk for medical complications however his weight remains at around 65% of the usual recommended weight for a person his height and if he continues to restrict on an outpatient basis this could lead to severe medical complications as a result of his  psychiatric condition and put put him at risk for harm or death. If this is observed that he is not able to maintain adequate nutritional intake it is my opinion as his psychiatrist that he should be petitioned for involuntary commitment and brought to a facility for further evaluation as soon as possible. The patient does have a follow-up intake scheduled with the dietitian the day of discharge and will be escorted by his family and will be escorted by his family to his mental health appointment the day after discharge as well.  Acquanetta Sit, MD 11/15/2015, 8:08 AM

## 2015-11-15 NOTE — Progress Notes (Signed)
Appointment start time: 1400  Appointment end time: 1500  Patient was seen on 11/15/15 for nutrition counseling pertaining to disordered eating He is accompanied by his mom  Primary care provider: NA [facility]ist: [facility] tomorrow Any other medical team members: none Parents: Zee  Assessment [facility] and diagnosed with AN.  States he wasn't eating with regular frequency.   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 Thinks he might need to make changes States he likes fruits and vegetables Is interested in some nutrition education.  Was prescribed Remeron, but declined other anxiety medications Mom was he was ~125-130 lb prior.  Life dealt him some hard blows and he started eating less and losing weight.   He states he has always been physically active.   Before he would eat 1 frozen pizza/day  Denies body image issues or desire to be thin. Going forward he thinks he needs to eat more variety.  Went to store today.  Mom reports he wants to get better Remeron is helping with sleep  Is living with mom now.  States he will be responsible for his own meals and snacks Lactose intolerant.  Ensure ok   Weight history:  Highest weight: 1250-130   Lowest weight: 103 lb Most consistent weight: 125 lb  What would you like to weigh: 125 lb How has weight changed in the past year: lost ~25 lb  Medical Information:  Changes in hair, skin, nails since ED started: none Chewing/swallowing difficulties : none Relux or heartburn: none Trouble with teeth: none Constipation, diarrhea: none.  Normal BM Negative for dizziness No headache No difficulty focusing Improving energy Not sure about cold intolerance Poor mood prior to hospitalization.  Improving mood   Mental health diagnosis: [facility]xia, [facility].  This provider questions that diagnosis   Dietary assessment: A typical day consists of 3 meals and 0 snacks Supplemented with  ensure, if needed  Safe foods include: pizza Avoided foods include:pork, fried foods, not much beef (mom is vegetarian) he eats more lean meats  24 hour recall:  B: bacon, biscuit L: zucchini, carrots, fruit cup, Ensure D: rice, zucchini, mixed vegetable, Ensure  B: blueberry muffin, bacon Missed lunch   What Methods Do You Use To Control Your Weight (Compensatory behaviors)?  denies           Restricting (calories, fat, carbs)  Administered EAT-26 Score: 2, insignificant    Estimated energy intake: 1300-1400 kcal  Estimated energy needs: 2700-3000 kcal 350 g CHO 140 g pro 93 g fat  Nutrition Diagnosis: NI-1.4 Inadequate energy intake As related to meal skipping.  As evidenced by ~25 lb weight loss.  Intervention/Goals: Nutrition counseling provided.  Built rapport.  Explained role of outpatient RD and goals for nutrition rehabilitation.  Discussed food is fuel and what happens when body and mind don't get enough.  Discussed importance of adequate nutrition at home.  Advised dietary exchanges.     Meal plan:    3 meals    1-2 snacks To provide 1600 kcal    200 g CHO    80 g pro   53 g fat  # exchanges: Dairy: 3 Fruit: 3 Vegetables: 3 Starch: 7 Protein: 4 Fat: 6   Monitoring and Evaluation: Patient will follow up in 1 weeks.

## 2015-11-15 NOTE — Clinical Social Work Note (Signed)
Patient assessed for Transitional Care Team.  Santa GeneraAnne Cunningham, LCSW Lead Clinical Social Worker Phone:  [phone]

## 2015-11-15 NOTE — [facility] Group Notes (Signed)
[facility] Group Notes:  (Nursing/MHT/Case Management/Adjunct)  Date:  11/15/2015  Time:  2:17 PM  Type of Therapy:  Group Therapy  Participation Level:  Did Not Attend   Sallee Langenne C Cunningham 11/15/2015, 2:17 PM

## 2015-11-15 NOTE — Patient Instructions (Addendum)
Dairy: 3 Fruit: 3 Vegetables: 3 Starch: 7 Protein: 4 Fat: 6  3 meals at least, maybe 1-2 snacks  Poptart: 2 starches, 1 fat 2 cookies: 1 starch, 1 fat Nutrigrain bar Nutri-grain bar: 2 starches  Ensure: 1 starch, 1 dairy, 1 protein, 1 fat   Next appointment 10/20 at 8 am

## 2015-11-15 NOTE — Clinical Social Work Note (Signed)
Transitional Care Team asked to consider patient for IPRS funded slot.  Team asked to see him today prior to discharge.  Santa GeneraAnne Cunningham, LCSW Lead Clinical Social Worker Phone:  [phone]

## 2015-11-15 NOTE — Psychosocial Assessment (Signed)
Patient reports on self inventory sheet that he slept well last night without sleep medication use. Patient reported a good appetite in the last 24 hrs, normal energy level, and good concentration for the day. Patient reported on a 0-10 scale a 0 for feelings of depression, hopelessness, and anxiety. Patient was not withdrawing from drugs or alcohol, and did not report any physical problems, or physical pain. Patient also reported that he did not have thoughts about hurting himself or others. Patient stated that most important goal for the day was to form a discharge plan. To meet this goal he reported that he will contact a Child psychotherapistsocial worker and has marked that he has helped to develop his discharge plan. Patient also indicated that there was nothing that would keep him from following his discharge plan. Patient reported that he did not have anything to tell the staff, or questions for the staff.   While talking with the patient he seemed to be alert times 4 and was cooperative. Patient displayed pressured speaking, and was repetitive when discussing his past job and living history. Patient also had a muscle tic in his neck when talking when first talking with him and completely avoid eye contact while talking. Patient had no other visible issues and was able to clearly respond while talking.   While talking with the patient I encouraged expression of feelings and discussed causes of anxiety. We also discussed effective coping mechanisms and future plans for continuing care after discharge. Patients biggest issue appeared to be with proper nutrition. Patient expressed that he recognized that this was an issue for him. We discussed resources to getting access to food and the importance of seeing the nutritionist.  Berdine DanceZach Merrit Waugh SN

## 2015-11-15 NOTE — [facility] Group Notes (Signed)
[facility] Group Notes:  (Nursing/MHT/Case Management/Adjunct)  Date:  11/15/2015  Time:  9:49 AM  Type of Therapy:  Psychoeducational Skills  Participation Level:  Did Not Attend  Participation Quality:  N/A  Affect:  N/A  Cognitive:  N/A  Insight:  None  Engagement in Group:  None  Modes of Intervention:  N/A  Summary of Progress/Problems: Patient was invited but did not attend.   Stanley Moon 11/15/2015, 9:49 AM 

## 2015-11-15 NOTE — Progress Notes (Signed)
Data. Patient denies SI/HI/AVH. Patient isolating in his room, except when staff are with him to observe him eating and for one hour after. Patient does not initiate interactions and only gives minimal and hurried responses to questions. Patient Has pressured/rushed speech. Patient's affect is blunt and he refuses to make eye contact.  Action. Emotional support and encouragement offered. Education provided on the importance of a balanced diet. Q 15 minute checks done for safety. Response. Safety on the unit maintained through 15 minute checks.  Remained calm throughout shift.  Pt. discharged to lobby and to his mother.  Belongings sheet reviewed and signed by pt. and all belongings, including sample medications and scripts, sent home. Paperwork reviewed and pt. able to verbalize understanding of education. Pt. in no current distress and ambulatory.

## 2015-11-15 NOTE — Discharge Summary (Signed)
Physician Discharge Summary Note  Patient:  Stanley Moon is an 25 y.o., male MRN:  [id number] DOB:  11/06/1990 Patient phone:  [phone] (home)  Patient address:   [address] [place] [address],  Total Time spent with patient: 30 minutes  Date of Admission:  11/05/2015 Date of Discharge: 11/15/2015  Reason for Admission:  Isolative behavior, withdrawn, severe weight loss  Principal Problem: Major depressive disorder, single episode, severe without psychotic features [facility](HCC) Discharge Diagnoses: Patient Active Problem List   Diagnosis Date Noted  . GAD (generalized anxiety disorder) [F41.1] 11/11/2015  . 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 Anorexia nervosa [F50.00] 11/05/2015    Past Psychiatric History:  See HPI  Past Medical History:  Past Medical History:  Diagnosis Date  . Asthma    as a child, "I haven't used inhalers in years"   History reviewed. No pertinent surgical history. Family History: History reviewed. No pertinent family history. Family Psychiatric  History: see HPI Social History:  History  Alcohol Use No     History  Drug Use No    Social History   Social History  . Marital status: Single    Spouse name: N/A  . Number of children: N/A  . Years of education: N/A   Social History Main Topics  . Smoking status: Never Smoker  . Smokeless tobacco: Never Used  . Alcohol use No  . Drug use: No  . Sexual activity: Not Asked   Other Topics Concern  . None   Social History Narrative  . None    Hospital Course:   Stanley Moon, 25 yo, came in with IVC petition by his own mother after he exhibited isolative and withdrawn behaviors.  Furthermore, patient had also undergone severe weight loss and his mother was concerned about depression and suicidal ideations.     Stanley Moon was admitted for Major depressive disorder, single episode, severe without psychotic  features (HCC) and crisis management.  Patient was treated with medications with their indications listed below in detail under Medication List.  Medical problems were identified and treated as needed.  Home medications were restarted as appropriate.  Improvement was monitored by observation and Stanley Moon daily report of symptom reduction.  Emotional and mental status was monitored by daily self inventory reports completed by Stanley Moon and clinical staff.  Patient reported continued improvement, denied any new concerns.  Patient had been compliant on medications and denied side effects.  Support and encouragement was provided.    Patient encouraged to attend groups to help with recognizing triggers of emotional crises and de-stabilizations.  Patient encouraged to attend group to help identify the positive things in life that would help in dealing with feelings of loss, depression and unhealthy or abusive tendencies.   The patient has anorexia nervosa and while on the ward he has continued to engage in restricting behavior.  Lab results are stable and medically stable.  However, he remains severely underweight and and could eventually lead to severe negative health effects and even death.  Per Dr Mckinley Jewelates,  If this is observed that he is not able to maintain adequate nutritional intake it is her opinion as his psychiatrist that he should be petitioned for involuntary commitment and brought to a facility for further evaluation as soon as possible. The patient does have a follow-up intake scheduled with the dietitian the day of discharge and will be escorted by his family and will  be escorted by his family to his mental health appointment the day after discharge as well.       Stanley Spry was evaluated by the treatment team for stability and plans for continued recovery upon discharge.  Patient was offered further treatment options upon discharge including Residential, Intensive Outpatient and Outpatient  treatment. Patient will follow up with agency listed below for medication management and counseling.  Encouraged patient to maintain satisfactory support network and home environment.  Advised to adhere to medication compliance and outpatient treatment follow up.  Prescriptions provided.       Physical Findings: AIMS: Facial and Oral Movements Muscles of Facial Expression: None, normal Lips and Perioral Area: None, normal Jaw: None, normal Tongue: None, normal,Extremity Movements Upper (arms, wrists, hands, fingers): None, normal Lower (legs, knees, ankles, toes): None, normal, Trunk Movements Neck, shoulders, hips: None, normal, Overall Severity Severity of abnormal movements (highest score from questions above): None, normal Incapacitation due to abnormal movements: None, normal Patient's awareness of abnormal movements (rate only patient's report): No Awareness, Dental Status Current problems with teeth and/or dentures?: No Does patient usually wear dentures?: No  CIWA:  CIWA-Ar Total: 1 COWS:  COWS Total Score: 1  Musculoskeletal: Strength & Muscle Tone: within normal limits Gait & Station: normal Patient leans: N/A  Psychiatric Specialty Exam: Physical Exam  Nursing note and vitals reviewed. Psychiatric: He has a normal mood and affect. His speech is normal and behavior is normal. Judgment and thought content normal. Thought content is not paranoid and not delusional. Cognition and memory are normal. He expresses no homicidal and no suicidal ideation.    Review of Systems  Constitutional: Negative.   HENT: Negative.   Eyes: Negative.   Respiratory: Negative.   Cardiovascular: Negative.   Gastrointestinal: Negative.   Genitourinary: Negative.   Musculoskeletal: Negative.   Skin: Negative.   Neurological: Negative.   Endo/Heme/Allergies: Negative.   Psychiatric/Behavioral: Negative.   All other systems reviewed and are negative.   Blood pressure 109/73, pulse 74,  temperature 97.6 F (36.4 C), resp. rate 16, height 5\' 10"  (1.778 m), weight 47.2 kg (104 lb), SpO2 100 %.Body mass index is 14.92 kg/m.    Have you used any form of tobacco in the last 30 days? (Cigarettes, Smokeless Tobacco, Cigars, and/or Pipes): No  Has this patient used any form of tobacco in the last 30 days? (Cigarettes, Smokeless Tobacco, Cigars, and/or Pipes) Yes, N/A  Blood Alcohol level:  Lab Results  Component Value Date   ETH <5 11/05/2015    Metabolic Disorder Labs:  No results found for: HGBA1C, MPG No results found for: PROLACTIN No results found for: CHOL, TRIG, HDL, CHOLHDL, VLDL, LDLCALC  See Psychiatric Specialty Exam and Suicide Risk Assessment completed by Attending Physician prior to discharge.  Discharge destination:  Home  Is patient on multiple antipsychotic therapies at discharge:  No   Has Patient had three or more failed trials of antipsychotic monotherapy by history:  No  Recommended Plan for Multiple Antipsychotic Therapies: NA     Medication List    STOP taking these medications   doxycycline 100 MG tablet Commonly known as:  VIBRA-TABS   multivitamin with minerals Tabs tablet     TAKE these medications     Indication  hydrOXYzine 25 MG tablet Commonly known as:  ATARAX/VISTARIL Take 1 tablet (25 mg total) by mouth 3 (three) times daily as needed for anxiety.  Indication:  Anxiety Neurosis   mirtazapine 15 MG tablet Commonly known as:  REMERON Take 1 tablet (15 mg total) by mouth at bedtime.  Indication:  Trouble Sleeping, Major Depressive Disorder   OLANZapine zydis 5 MG disintegrating tablet Commonly known as:  ZYPREXA Take 1 tablet (5 mg total) by mouth at bedtime.  Indication:  Manic-Depression      Follow-up Information    [facility] Follow up on 11/15/2015.   Why:  Initial appointment w nutritionist on 10/12 at 3 PM.  Please call to cancel/reschedule if needed.   Contact  information: [address]  [place], [place] [address] Phone: [phone] Fax:  PROVIDER HAS ACCESS TO EPIC        [facility] Follow up on 11/16/2015.   Specialty:  Behavioral Health Why:  Appointment for walk in for assessment of needs. Arrive any time between 8am-3pm. Arrive early if possible to decrease your wait time. [address] [place] [address] [phone]          Follow-up recommendations:  Activity:  as tol Diet:  as tol  Comments:  1.  Take all your medications as prescribed.   2.  Report any adverse side effects to outpatient provider. 3.  Patient instructed to not use alcohol or illegal drugs while on prescription medicines. 4.  In the event of worsening symptoms, instructed patient to call 911, the crisis hotline or go to nearest emergency room for evaluation of symptoms.  Signed: Lindwood Qua, NP [facility] 11/15/2015, 11:45 AM

## 2015-11-15 NOTE — Progress Notes (Signed)
  [facility] Discharge [facility] :  Will you be returning to the same living situation after discharge:  Yes,  w mother At discharge, do you have transportation home?: Yes,  w mother Do you have the ability to pay for your medications: No.  [facility], both of which can assist w needs.   Release of information consent forms completed and in the chart;  Patient's signature needed at discharge.  Patient to Follow up at: Follow-up Information    [facility] Follow up on 11/15/2015.   Why:  Initial appointment w nutritionist on 10/12 at 3 PM.  Please call to cancel/reschedule if needed.   Contact information: [address],  [place], [place] [address] Phone: [phone] Fax:  PROVIDER HAS ACCESS TO EPIC        [facility] Follow up on 11/16/2015.   Specialty:  Behavioral Health Why:  Appointment for walk in for assessment of needs. Arrive any time between 8am-3pm. Arrive early if possible to decrease your wait time. Contact information: [address] [place] [address] [phone]           Next level of care provider has access to [facility] Link:no  Safety Planning and Suicide Prevention discussed: Yes,  w mother  Have you used any form of tobacco in the last 30 days? (Cigarettes, Smokeless Tobacco, Cigars, and/or Pipes): No  Has patient been referred to the Quitline?: N/A patient is not a smoker  Patient has been referred for addiction treatment: Yes  Sallee Langenne C Angelino Rumery 11/15/2015, 10:20 AM

## 2015-11-15 NOTE — Tx Team (Signed)
Interdisciplinary Treatment and Diagnostic Plan Update  11/15/2015 Time of Session: 9:30 AM Stanley Moon MRN: [id number]  Principal Diagnosis: Major depressive disorder, single episode, severe without psychotic features ([place])  Secondary Diagnoses: Principal Problem:   Major depressive disorder, single episode, severe without psychotic features ([place]) Active Problems:   Anorexia nervosa   GAD (generalized anxiety disorder)   Current Medications:  Current Facility-Administered Medications  Medication Dose Route Frequency Provider Last Rate Last Dose  . acetaminophen (TYLENOL) tablet 650 mg  650 mg Oral Q6H PRN Patrecia Pour, NP      . alum & mag hydroxide-simeth (MAALOX/MYLANTA) 200-200-20 MG/5ML suspension 30 mL  30 mL Oral Q4H PRN Patrecia Pour, NP      . feeding supplement (ENSURE ENLIVE) (ENSURE ENLIVE) liquid 237 mL  237 mL Oral TID BM PRN Linard Millers, MD   237 mL at 11/14/15 1829  . hydrOXYzine (ATARAX/VISTARIL) tablet 25 mg  25 mg Oral TID PRN Patrecia Pour, NP   25 mg at 11/07/15 1857  . magnesium hydroxide (MILK OF MAGNESIA) suspension 30 mL  30 mL Oral Daily PRN Patrecia Pour, NP      . mirtazapine (REMERON) tablet 15 mg  15 mg Oral QHS Patrecia Pour, NP   15 mg at 11/14/15 2038  . OLANZapine zydis (ZYPREXA) disintegrating tablet 5 mg  5 mg Oral QHS Linard Millers, MD      . ondansetron [facility]) tablet 4 mg  4 mg Oral Q8H PRN Patrecia Pour, NP        PTA Medications: Prescriptions Prior to Admission  Medication Sig Dispense Refill Last Dose  . doxycycline (VIBRA-TABS) 100 MG tablet Take 1 tablet (100 mg total) by mouth 2 (two) times daily. (Patient not taking: Reported on 11/04/2015) 28 tablet 0 Not Taking at Unknown time  . Multiple Vitamin (MULTIVITAMIN WITH MINERALS) TABS tablet Take 2 tablets by mouth at bedtime.       Treatment Modalities: Medication Management, Group therapy, Case management,  1 to 1 session with clinician, Psychoeducation,  Recreational therapy.   Physician Treatment Plan for Primary Diagnosis: Major depressive disorder, single episode, severe without psychotic features ([place]) Long Term Goal(s): Improvement in symptoms so as ready for discharge  Short Term Goals: Ability to identify changes in lifestyle to reduce recurrence of condition will improve and Ability to maintain clinical measurements within normal limits will improve  Medication Management: Evaluate patient's response, side effects, and tolerance of medication regimen.  Therapeutic Interventions: 1 to 1 sessions, Unit Group sessions and Medication administration.  Evaluation of Outcomes: Adequate for Discharge  Physician Treatment Plan for Secondary Diagnosis: Principal Problem:   Major depressive disorder, single episode, severe without psychotic features ([place]) Active Problems:   Anorexia nervosa   GAD (generalized anxiety disorder)   Long Term Goal(s): Improvement in symptoms so as ready for discharge  Short Term Goals: Ability to identify changes in lifestyle to reduce recurrence of condition will improve, Ability to verbalize feelings will improve and Ability to identify and develop effective coping behaviors will improve  Medication Management: Evaluate patient's response, side effects, and tolerance of medication regimen.  Therapeutic Interventions: 1 to 1 sessions, Unit Group sessions and Medication administration.  Evaluation of Outcomes: Adequate for Discharge   RN Treatment Plan for Primary Diagnosis: Major depressive disorder, single episode, severe without psychotic features ([place]) Long Term Goal(s): Knowledge of disease and therapeutic regimen to maintain health will improve  Short Term Goals: Ability to verbalize feelings  will improve and Ability to identify and develop effective coping behaviors will improve  Medication Management: RN will administer medications as ordered by provider, will assess and evaluate patient's  response and provide education to patient for prescribed medication. RN will report any adverse and/or side effects to prescribing provider.  Therapeutic Interventions: 1 on 1 counseling sessions, Psychoeducation, Medication administration, Evaluate responses to treatment, Monitor vital signs and CBGs as ordered, Perform/monitor CIWA, COWS, AIMS and Fall Risk screenings as ordered, Perform wound care treatments as ordered.  Evaluation of Outcomes: Adequate for Discharge   LCSW Treatment Plan for Primary Diagnosis: Major depressive disorder, single episode, severe without psychotic features ([place]) Long Term Goal(s): Safe transition to appropriate next level of care at discharge, Engage patient in therapeutic group addressing interpersonal concerns.  Short Term Goals: Engage patient in aftercare planning with referrals and resources  Therapeutic Interventions: Assess for all discharge needs, 1 to 1 time with Social worker, Explore available resources and support systems, Assess for adequacy in community support network, Educate family and significant other(s) on suicide prevention, Complete Psychosocial Assessment, Interpersonal group therapy.  Evaluation of Outcomes: Met   Progress in Treatment: Attending groups: Yes Participating in groups: Yes Taking medication as prescribed: Yes, MD continues to assess for medication changes as needed Toleration medication: Yes, no side effects reported at this time Family/Significant other contact made: mother Patient understands diagnosis: Minimally Discussing patient identified problems/goals with staff: Yes Medical problems stabilized or resolved: Yes Denies suicidal/homicidal ideation:  Issues/concerns per patient self-inventory: None Other: N/A  New problem(s) identified: None identified at this time.   New Short Term/Long Term Goal(s): None identified at this time.   Discharge Plan or Barriers:  Patient continues to state he wants to follow  up outpatient, MD concerned about BMI, pt has been referred to [facility] for inpt tx, awaiting response.  Has appt w outpatient nutritionist, has also been seen by inpt nutrition consult, food log maintained.  10/12:  Has appts w nutritionist, referral to [facility] and Transitional Care TEam has been asked to assist.    Reason for Continuation of Hospitalization: Anxiety Delusions  Depression Hallucinations Homicidal ideation [place] Medical Issues Medication stabilization Suicidal ideation Withdrawal symptoms  Estimated Length of Stay: discharge today  Attendees: Patient:  Stanley Moon 11/15/2015  10:22 AM  Physician: Army Chaco MD 11/15/2015  10:22 AM  Nursing: Pauline Aus RN 11/15/2015  10:22 AM  RN Care Manager:  11/15/2015  10:22 AM  Social Worker: Eusebio Me LCSW 11/15/2015  10:22 AM  Recreational Therapist:  11/15/2015  10:22 AM  Other:  11/15/2015  10:22 AM  Other:  11/15/2015  10:22 AM  Other: 11/15/2015  10:22 AM    Scribe for Treatment Team:  Edwyna Shell, [place] Worker Phone:  [phone]

## 2015-11-23 ENCOUNTER — Encounter: Payer: Self-pay | Admitting: *Deleted

## 2015-11-23 ENCOUNTER — Telehealth: Payer: Self-pay | Admitting: *Deleted

## 2015-11-23 DIAGNOSIS — E43 Unspecified severe protein-calorie malnutrition: Secondary | ICD-10-CM

## 2015-11-23 NOTE — Telephone Encounter (Signed)
Mom called to notify  This provider Trenell not compliant with 3 meals and several snacks.  Mom reports he is eating more than before, but not what he is reporting.  Mom also does not believe he weighs 113 lb per the scale today.  Mom believes higher level of care needed.  She will be in contact with a medical provider

## 2015-11-23 NOTE — Progress Notes (Signed)
Appointment start time: 0830  Appointment end time: 0845  Patient was seen on 11/23/15 for nutrition counseling pertaining to disordered eating He is accompanied by his mom  Primary care provider: NA [facility] Any other medical team members: none Parents: Christel MormonZee  Assessment Has not seen therapist.  Is going [facility] this [facility].  There was a scheduling conflict.   Thinks his eating went well.  Thinks he is eating more frequently throughout the day.  He is trying to following a routine.  He says it hasn't been challenging, per se, but following a schedule is hard with school and [facility].  He denies any GI distress or issues with eating more.   Typical day is 3 meals, and several snacks Improving energy Still taking Remeron and multivitamin  No concerns.  Is pleased with his progress, but knows he has still progress yet to be made  Wt Readings from Last 3 Encounters:  11/23/15 113 lb 3.2 oz (51.3 kg)  11/15/15 104 lb (47.2 kg)  08/29/13 135 lb (61.2 kg)     Weight history:  Highest weight: 250-130   Lowest weight: 103 lb Most consistent weight: 125 lb  What would you like to weigh: 125 lb How has weight changed in the past year: lost ~25 lb  Medical Information:  Changes in hair, skin, nails since ED started: none Chewing/swallowing difficulties : none Relux or heartburn: none Trouble with teeth: none Constipation, diarrhea: none.  Normal BM Negative for dizziness No headache No difficulty focusing Improving energy Not sure about cold intolerance Poor mood prior to hospitalization.  Improving mood   Mental health diagnosis: anorexia, per [facility].  This provider questions that diagnosis   Dietary assessment:  Safe foods include: pizza Avoided foods include:pork, fried foods, not much beef (mom is vegetarian) he eats more lean meats  24 hour recall:  B: Ensure with muffin or toast or bacon or bar L: smoothie with fruit, water.  Hot dog or  sandwich or chicken pot pie or frozen dinner  D: Beans and hot dog or pasta with vegetables and chicken and toast.  Mostly frozen meals.  Sometimes pizza or soup   What Methods Do You Use To Control Your Weight (Compensatory behaviors)?  denies           Restricting (calories, fat, carbs)      Estimated energy intake: 1500-2000 kcal  Estimated energy needs: 2700-3000 kcal 350 g CHO 140 g pro 93 g fat  Nutrition Diagnosis: NI-1.4 Inadequate energy intake As related to meal skipping.  As evidenced by ~25 lb weight loss.  Intervention/Goals: Nutrition counseling provided. Praised progress!!!  Keep it up.  Advised need for adequate calcium rich dairy.  He agreed to try yogurt this week.  Gave coupons for more Boost.  Please follow up with [facility]  Meal plan:    3 meals    1-2 snacks To provide 1600 kcal    200 g CHO    80 g pro   53 g fat  # exchanges: Dairy: 3 Fruit: 3 Vegetables: 3 Starch: 7 Protein: 4 Fat: 6   Monitoring and Evaluation: Patient will follow up in 1 weeks.

## 2015-11-30 ENCOUNTER — Encounter: Payer: Self-pay | Admitting: *Deleted

## 2015-11-30 ENCOUNTER — Telehealth: Payer: Self-pay | Admitting: *Deleted

## 2015-11-30 DIAGNOSIS — E43 Unspecified severe protein-calorie malnutrition: Secondary | ICD-10-CM

## 2015-11-30 DIAGNOSIS — F509 Eating disorder, unspecified: Secondary | ICD-10-CM

## 2015-11-30 NOTE — Progress Notes (Signed)
Appointment start time: 0800  Appointment end time: 0830  Patient was seen on 11/30/15 for nutrition counseling pertaining to disordered eating He is accompanied by his mom  Primary care provider: NA Therapist: [facility] some point Any other medical team members: none. Appointment monday Parents: Christel MormonZee  [facility], but got there too late.  Is rescheduled for this coming Monday to see a physician to get a medical assessment.  He also has not seen a therapist, but plans to schedule when he's off classes  In a week.  He is working on Press photographerfinishing up school.    Feels like his eating is the same as last week.  He is out of food stamps and waiting for those to kick back in.   He is trying to figure out how to get more dairy.  He got some Boost via coupons received.  He doesn't think he has decreased his intake any.  He says he is eating soups, beans, noodles, rice, chicken He sometimes make smoothies.    No GI distress.       Wt Readings from Last 3 Encounters:  11/30/15 112 lb 12.8 oz (51.2 kg)  11/23/15 113 lb 3.2 oz (51.3 kg)  08/29/13 135 lb (61.2 kg)     M[facility] health [facility]: [facility].  This provider questions that diagnosis   Dietary assessment:  Safe foods include: pizza Avoided foods include:pork, fried foods, not much beef (mom is vegetarian) he eats more lean meats  24 hour recall:  B: poptart, 2 nutrigran bar Boost Toast, strawberries, banana 1/2 voila frozen meal.  terryaki chicken with broccoli and carrots Chicken pot pie soup juice   What Methods Do You Use To Control Your Weight (Compensatory behaviors)?  denies           Restricting (calories, fat, carbs)     Estimated energy intake: 1500-2000 kcal  Estimated energy needs: 2700-3000 kcal 350 g CHO 140 g pro 93 g fat  Nutrition Diagnosis: NI-1.4 Inadequate energy intake As related to meal skipping.  As evidenced by ~25 lb weight loss.  Intervention/Goals:  Nutrition counseling provided. Praised progress!  Recommended following up with MD and counseling.  Due to stable weight, need to increase intake.  Advised morning snack while at school: boost, sandwich, bar, etc.  He agreed.  Recommended also protein with breakfast: eggs, bacon, sausage.  Brainstormed ways to increase daily.  Denies need for more Boost coupons or samples today    Monitoring and Evaluation: Patient will follow up in 1 weeks.

## 2015-11-30 NOTE — Patient Instructions (Addendum)
Try almond butter Try cheese and crackers Try cheese quesadilla (maybe with some chicken) Try yogurt Cheese on broccoli or baked potato  Grilled cheese  Please bring Boost to school to have in the morning. Also fruit Or sandwich and or/bar  Try to get some protein in the morning: yogurt or bacon or sausage or eggs or almond butter

## 2015-11-30 NOTE — Telephone Encounter (Signed)
Gave mom update on his weight (essentially stable).  Mom reports he stopped taking Remeron.  She's taking him to MD on Monday and hopes to reestablish Remeron.  Informed her of the addition to morning snack to his meal plan.  Mom voiced understanding and agreement

## 2015-12-07 ENCOUNTER — Encounter: Payer: Self-pay | Attending: Psychiatry | Admitting: *Deleted

## 2015-12-07 DIAGNOSIS — E43 Unspecified severe protein-calorie malnutrition: Secondary | ICD-10-CM

## 2015-12-07 DIAGNOSIS — Z713 Dietary counseling and surveillance: Secondary | ICD-10-CM | POA: Insufficient documentation

## 2015-12-07 DIAGNOSIS — F5000 Anorexia nervosa, unspecified: Secondary | ICD-10-CM | POA: Insufficient documentation

## 2015-12-07 NOTE — Progress Notes (Signed)
   Appointment start time: 0800  Appointment end time: 0830  Patient was seen on 12/07/15 for nutrition counseling pertaining to disordered eating  Primary care provider: scheduled on 11/17 [facility] Any other medical team members: none.  Parents: Christel MormonZee  [facility] and [facility] an [facility] for [facility] care on 11/17.  He will also be applying for the orange card.  He has still not established with the counselor.   Stopped taking Remeron.  Didn't like the side effects.  He is also opposed to any psychotropic medications.  He is opposed to any form of medication.  He tried to eat adequately this past week.  He had 3 meals, but was waiting to get his food stamps today.  He is planning on going to the grocery store today and is looking forward to that  He has been eating more of a variety of foods and is looking forward to further increasing his variety  Energy is getting better No GI distress.   Did not add the morning snack.  Will not have the delay coming up as his class schedule is changing, but thinks he will try to get in the snack anyway.  He can tell food is improving his health   Wt Readings from Last 3 Encounters:  12/07/15 116 lb 3.2 oz (52.7 kg)  11/30/15 112 lb 12.8 oz (51.2 kg)  11/23/15 113 lb 3.2 oz (51.3 kg)     [facility]: [facility].  This provider questions that diagnosis   Dietary assessment:  Safe foods include: pizza Avoided foods include:pork, fried foods, not much beef (mom is vegetarian) he eats more lean meats  24 hour recall:  B: Boost and orange L: 1/2 can soup with toast, bar D; beans, Ramen noodles, chicken pot pie, slice bread S: chocolate chip cookies   Estimated energy intake: 1500-2000 kcal  Estimated energy needs: 2700-3000 kcal 350 g CHO 140 g pro 93 g fat  Nutrition Diagnosis: NI-1.4 Inadequate energy intake As related to meal skipping.  As evidenced by ~25 lb weight  loss.  Intervention/Goals: Nutrition counseling provided. Praised progress! Reiterated morning snack while at school: boost, sandwich, bar, etc.  He agreed.  Reminded also protein with breakfast: eggs, bacon, sausage.  Brainstormed ways to increase daily.  Denies need for more Boost coupons or samples today    Monitoring and Evaluation: Patient will follow up in 2 weeks.

## 2015-12-07 NOTE — Patient Instructions (Signed)
Try not to go longer than 5 hours without eating.  Please take something with you to school like a Boost or a bar or a sandwich in the future   Otherwise keep it up!!!! Please try to get appointment with the counselor Keep you appointment with doctor on 11/17

## 2015-12-21 ENCOUNTER — Ambulatory Visit: Payer: Self-pay | Attending: Family Medicine | Admitting: Family Medicine

## 2015-12-21 ENCOUNTER — Encounter: Payer: Self-pay | Admitting: *Deleted

## 2015-12-21 ENCOUNTER — Encounter: Payer: Self-pay | Admitting: Family Medicine

## 2015-12-21 DIAGNOSIS — Z681 Body mass index (BMI) 19 or less, adult: Secondary | ICD-10-CM | POA: Insufficient documentation

## 2015-12-21 DIAGNOSIS — Z0000 Encounter for general adult medical examination without abnormal findings: Secondary | ICD-10-CM | POA: Insufficient documentation

## 2015-12-21 DIAGNOSIS — E43 Unspecified severe protein-calorie malnutrition: Secondary | ICD-10-CM

## 2015-12-21 DIAGNOSIS — R636 Underweight: Secondary | ICD-10-CM

## 2015-12-21 NOTE — Patient Instructions (Addendum)
Stanley Moon was seen today for depression.  Diagnoses and all orders for this visit:  Underweight  your weight goal is 130 lbs   F/u in 3 months for wellness physical, sooner if needed  Dr. Armen PickupFunches

## 2015-12-21 NOTE — Patient Instructions (Addendum)
You're on the right track, we just need to stay consistent please add protein to breakfast Remember to pack snacks for school And don't go longer than 5 hours without eating  Try to get in some more dairy like yogurt or more cheese.  Try Lactaid tablet, if needed  Next appointment 12/1 at 8.

## 2015-12-21 NOTE — Progress Notes (Signed)
Pt is here to establish care. Pt states that he has no pain and no problems today. Pt PHQ-9 and GAD 7 score is 0  Pt declined flu shot.

## 2015-12-21 NOTE — Assessment & Plan Note (Signed)
Underweight male Ensure provided Advise weight goal of 130 # F/u for wellness physical/weight check in 3 months

## 2015-12-21 NOTE — Progress Notes (Signed)
LOGO@  Subjective:  Patient ID: Stanley Moon, male    DOB: 07/30/1990  Age: 25 y.o. MRN: [id number]  CC: Weight Loss   HPI Stanley Moon presents for    1.  Weight loss: patient was homeless for most of the last 2 years. He lost weight. He reports his weight loss was unintentional. He returned to live with his mother who he reports had him involuntarily committed at behavioral health due to concerns about anorexia, anxiety and depression. He denies depression and anxiety. He is eating 3 meals per day. He is currently living with his mother with his 23 yo brother.   Past Medical History:  Diagnosis Date  . Asthma    as a child, "I haven't used inhalers in years"  . Syphilis 08/29/2013    No past surgical history on file.  No family history on file.  Social History  Substance Use Topics  . Smoking status: Never Smoker  . Smokeless tobacco: Never Used  . Alcohol use No    ROS Review of Systems  Constitutional: Negative for chills, fatigue, fever and unexpected weight change.  Eyes: Negative for visual disturbance.  Respiratory: Negative for cough and shortness of breath.   Cardiovascular: Negative for chest pain, palpitations and leg swelling.  Gastrointestinal: Negative for abdominal pain, blood in stool, constipation, diarrhea, nausea and vomiting.  Endocrine: Negative for polydipsia, polyphagia and polyuria.  Musculoskeletal: Negative for arthralgias, back pain, gait problem, myalgias and neck pain.  Skin: Negative for rash.  Allergic/Immunologic: Negative for immunocompromised state.  Hematological: Negative for adenopathy. Does not bruise/bleed easily.  Psychiatric/Behavioral: Negative for dysphoric mood, sleep disturbance and suicidal ideas. The patient is not nervous/anxious.     Objective:   Today's Vitals: BP 109/76 (BP Location: Right Arm, Patient Position: Sitting, Cuff Size: Small)   Pulse (!) 57   Temp 98.2 F (36.8 C) (Oral)   Ht 5\' 10"  (1.778 m)   Wt  118 lb 12.8 oz (53.9 kg)   SpO2 98%   BMI 17.05 kg/m  Wt Readings from Last 3 Encounters:  12/21/15 118 lb 12.8 oz (53.9 kg)  12/21/15 116 lb 12.8 oz (53 kg)  12/07/15 116 lb 3.2 oz (52.7 kg)    Physical Exam  Constitutional: He appears well-developed and well-nourished. No distress.  Thin   HENT:  Head: Normocephalic and atraumatic.  Neck: Normal range of motion. Neck supple.  Cardiovascular: Normal rate, regular rhythm, normal heart sounds and intact distal pulses.   Pulmonary/Chest: Effort normal and breath sounds normal.  Musculoskeletal: He exhibits no edema.  Neurological: He is alert.  Skin: Skin is warm and dry. No rash noted. No erythema.  Psychiatric: He has a normal mood and affect.   Depression screen PHQ 2/9 12/21/2015  Decreased Interest 0  Down, Depressed, Hopeless 0  PHQ - 2 Score 0  Altered sleeping 0  Tired, decreased energy 0  Change in appetite 0  Feeling bad or failure about yourself  0  Trouble concentrating 0  Moving slowly or fidgety/restless 0  Suicidal thoughts 0  PHQ-9 Score 0   GAD 7 : Generalized Anxiety Score 12/21/2015  Nervous, Anxious, on Edge 0  Control/stop worrying 0  Worry too much - different things 0  Trouble relaxing 0  Restless 0  Easily annoyed or irritable 0  Afraid - awful might happen 0  Total GAD 7 Score 0    Assessment & Plan:   Problem List Items Addressed This Visit  Unprioritized   Underweight      Outpatient Encounter Prescriptions as of 12/21/2015  Medication Sig  . hydrOXYzine (ATARAX/VISTARIL) 25 MG tablet Take 1 tablet (25 mg total) by mouth 3 (three) times daily as needed for anxiety.  . mirtazapine (REMERON) 15 MG tablet Take 1 tablet (15 mg total) by mouth at bedtime. (Patient not taking: Reported on 12/07/2015)  . Multiple Vitamin (MULTIVITAMIN) tablet Take 1 tablet by mouth daily.  Marland Kitchen. OLANZapine zydis (ZYPREXA) 5 MG disintegrating tablet Take 1 tablet (5 mg total) by mouth at bedtime. (Patient  not taking: Reported on 12/07/2015)   No facility-administered encounter medications on file as of 12/21/2015.     Follow-up: Return in about 3 months (around 03/22/2016) for wellness physical .    Dessa PhiJosalyn Elmina Hendel MD

## 2015-12-21 NOTE — Progress Notes (Signed)
  Appointment start time: 0800  Appointment end time: 0830  Patient was seen on 12/21/15 for nutrition counseling pertaining to disordered eating  Primary care provider: scheduled on 11/17 [facility] Any other medical team members: none.  Parents: Christel MormonZee  Assessment Was on break from class last week and now things are starting back up.   Has not tried yogurt, it nervous about the lactose.  He is doing more cheese (at least once a day).  He's been having cheese on sandwiches.  He has not increased his breakfast, but routinely has Boost and apple.  He would like to have those in the future and plans to do so.  He wasn't away from home as often last week so he didn't have his snacks.  He plans to bring snacks with him in the future.    Denies GI distress.  Thinks he's eating enough, but acknowledges he's not totally consistent.  Has improved energy and is sleeping well.   No anxiety. Is scheduled with [facility] for follow up in 2 weeks  Wt Readings from Last 3 Encounters:  12/21/15 116 lb 12.8 oz (53 kg)  12/07/15 116 lb 3.2 oz (52.7 kg)  11/30/15 112 lb 12.8 oz (51.2 kg)    [facility]: [facility].  This provider questions that diagnosis   Dietary assessment:  Safe foods include: pizza Avoided foods include:pork, fried foods, not much beef (mom is vegetarian) he eats more lean meats  24 hour recall:  B: Boost, apple, pineapple S: bar L: 2 hotdogs, mixed vegetables D: fettuccini alfredo, broccoli with cheese S: crackers Beverages: apple juice, water  Estimated energy intake: 1800-2000 kcal  Estimated energy needs: 2700-3000 kcal 350 g CHO 140 g pro 93 g fat  Nutrition Diagnosis: NI-1.4 Inadequate energy intake As related to meal skipping.  As evidenced by ~25 lb weight loss.  Intervention/Goals: Nutrition counseling provided. Discussed ways to maintain consistency.    Reiterated protein with breakfast, morning snack, and adequate  dairy.  He agreed   Monitoring and Evaluation: Patient will follow up in 2 weeks.

## 2016-01-01 ENCOUNTER — Ambulatory Visit: Payer: Self-pay

## 2016-01-04 ENCOUNTER — Encounter: Payer: Self-pay | Attending: Family Medicine | Admitting: *Deleted

## 2016-01-04 DIAGNOSIS — F5000 Anorexia nervosa, unspecified: Secondary | ICD-10-CM | POA: Insufficient documentation

## 2016-01-04 DIAGNOSIS — R636 Underweight: Secondary | ICD-10-CM

## 2016-01-04 DIAGNOSIS — Z713 Dietary counseling and surveillance: Secondary | ICD-10-CM | POA: Insufficient documentation

## 2016-01-04 DIAGNOSIS — E440 Moderate protein-calorie malnutrition: Secondary | ICD-10-CM

## 2016-01-04 NOTE — Progress Notes (Signed)
  Appointment start time: 0800  Appointment end time: 0830  Patient was seen on 01/04/16 for nutrition counseling pertaining to disordered eating  Primary care provider:  Dr. Armen PickupFunches [facility] Any other medical team members: none.  Parents: Christel MormonZee  Assessment Thinks eating is going ok.  He was not feeling well/sort of sick for a couple days.  Outside of that he is doing ok.  He reports minimal changes: is getting protein in the morning.  He tried yogurt and it didn't upset his stomach.  He is more optimistic about eating more in the future.  He is eating more cheese.   He is bringing snacks with him to campus (bars or poptart) to have a snack in the morning  Established primary care and is applying for the [employer] Card.  Got some Ensure from his doctor's office.  His SNAP benefits start on 12/3.  Thinks he didn't eat as much this week.  Thinks it's because he was sick and also some stress.  He has resolved the stress issue and hopes to move forward   [facility]  This provider questions that diagnosis   Dietary assessment:  Safe foods include: pizza Avoided foods include:pork, fried foods, not much beef (mom is vegetarian) he eats more lean meats  24 hour recall:  B: 3 strips bacon, Ensure, OJ S: 2 fruit and grain bars L: 2 hot dogs, mixed veggies D: mixed veggies, stir fried rice and chicken S:  Cookies Beverages: water   Estimated energy intake: 1800-2000 kcal  Estimated energy needs: 2700-3000 kcal 350 g CHO 140 g pro 93 g fat  Nutrition Diagnosis: NI-1.4 Inadequate energy intake As related to meal skipping.  As evidenced by ~25 lb weight loss.  Intervention/Goals: Nutrition counseling provided. Discussed ways to maintain consistency.   He agreed to add a second snack each day  Monitoring and Evaluation: Patient will follow up in 1 weeks.

## 2016-01-11 ENCOUNTER — Ambulatory Visit: Payer: Self-pay

## 2016-01-11 ENCOUNTER — Encounter: Payer: Self-pay | Admitting: *Deleted

## 2016-01-11 DIAGNOSIS — E43 Unspecified severe protein-calorie malnutrition: Secondary | ICD-10-CM

## 2016-01-11 DIAGNOSIS — R636 Underweight: Secondary | ICD-10-CM

## 2016-01-11 NOTE — Patient Instructions (Signed)
Keep up great work! 

## 2016-01-11 NOTE — Progress Notes (Addendum)
  Appointment start time: 0800  Appointment end time: 0815  Patient was seen on 01/11/16 for nutrition counseling pertaining to disordered eating  Primary care provider:  Dr. Armen PickupFunches [facility] Any other medical team members: none.  Parents: Christel MormonZee  Assessment Thinks eating is going well.  Thinks things are improving.  He is consistent with protein with breakfast.  He still has some ensure left and some coupons.  He plans to go shopping in the next couple weeks  [facility] and that will help with his stress level.  Some of his other stressors are working themselves out.    He takes snacks with his to class.  He has been eating some more crackers as a second snack or cookies or goldfish  no GI distress.  Good energy.  Sleeping well   [facility]: [facility].  This provider questions that diagnosis   Dietary assessment:  Safe foods include: pizza Avoided foods include:pork, fried foods, not much beef (mom is vegetarian) he eats more lean meats  24 hour recall:  B: bacon, bread (toast), OJ S: Ensure Sandwich (ham and cheese) with crackers D: baked beans with hot dogs, mixed veggies, noodles (oodles of noodles).  Water S: Ritz crackers  Beverages: water   Estimated energy intake: 1800-2000 kcal  Estimated energy needs: 2700-3000 kcal 350 g CHO 140 g pro 93 g fat  Nutrition Diagnosis: NI-1.4 Inadequate energy intake As related to meal skipping.  As evidenced by ~25 lb weight loss.  Intervention/Goals: Nutrition counseling provided. Discussed ways to maintain consistency.     Monitoring and Evaluation: Patient will follow up in 2 weeks.

## 2016-01-18 ENCOUNTER — Ambulatory Visit: Payer: Self-pay | Admitting: *Deleted

## 2016-01-21 ENCOUNTER — Ambulatory Visit: Payer: Self-pay | Attending: Family Medicine

## 2016-01-25 ENCOUNTER — Ambulatory Visit: Payer: Self-pay | Admitting: *Deleted

## 2016-02-06 ENCOUNTER — Ambulatory Visit: Payer: Self-pay | Admitting: *Deleted

## 2016-03-23 ENCOUNTER — Encounter (HOSPITAL_COMMUNITY): Payer: Self-pay

## 2016-03-23 ENCOUNTER — Emergency Department (HOSPITAL_COMMUNITY)
Admission: EM | Admit: 2016-03-23 | Discharge: 2016-03-23 | Disposition: A | Discharge status: Home / Self Care | Payer: Self-pay | Attending: Emergency Medicine | Admitting: Emergency Medicine

## 2016-03-23 DIAGNOSIS — Z91010 Allergy to peanuts: Secondary | ICD-10-CM | POA: Insufficient documentation

## 2016-03-23 DIAGNOSIS — Z7982 Long term (current) use of aspirin: Secondary | ICD-10-CM | POA: Insufficient documentation

## 2016-03-23 DIAGNOSIS — J45909 Unspecified asthma, uncomplicated: Secondary | ICD-10-CM | POA: Insufficient documentation

## 2016-03-23 DIAGNOSIS — Z79899 Other long term (current) drug therapy: Secondary | ICD-10-CM | POA: Insufficient documentation

## 2016-03-23 DIAGNOSIS — K2900 Acute gastritis without bleeding: Secondary | ICD-10-CM | POA: Insufficient documentation

## 2016-03-23 HISTORY — DX: Depression, unspecified: F32.A

## 2016-03-23 HISTORY — DX: Dermatitis, unspecified: L30.9

## 2016-03-23 HISTORY — DX: Anorexia: R63.0

## 2016-03-23 HISTORY — DX: Major depressive disorder, single episode, unspecified: F32.9

## 2016-03-23 HISTORY — DX: Anxiety disorder, unspecified: F41.9

## 2016-03-23 LAB — CBC
HEMATOCRIT: 38.1 % — AB (ref 39.0–52.0)
Hemoglobin: 13.1 g/dL (ref 13.0–17.0)
MCH: 29.5 pg (ref 26.0–34.0)
MCHC: 34.4 g/dL (ref 30.0–36.0)
MCV: 85.8 fL (ref 78.0–100.0)
Platelets: 231 10*3/uL (ref 150–400)
RBC: 4.44 MIL/uL (ref 4.22–5.81)
RDW: 12.1 % (ref 11.5–15.5)
WBC: 5.1 10*3/uL (ref 4.0–10.5)

## 2016-03-23 LAB — URINALYSIS, ROUTINE W REFLEX MICROSCOPIC
BILIRUBIN URINE: NEGATIVE
GLUCOSE, UA: NEGATIVE mg/dL
Hgb urine dipstick: NEGATIVE
KETONES UR: NEGATIVE mg/dL
Leukocytes, UA: NEGATIVE
Nitrite: NEGATIVE
PH: 7 (ref 5.0–8.0)
Protein, ur: NEGATIVE mg/dL
Specific Gravity, Urine: 1.005 (ref 1.005–1.030)

## 2016-03-23 LAB — COMPREHENSIVE METABOLIC PANEL
ALBUMIN: 4.8 g/dL (ref 3.5–5.0)
ALT: 24 U/L (ref 17–63)
AST: 32 U/L (ref 15–41)
Alkaline Phosphatase: 54 U/L (ref 38–126)
Anion gap: 7 (ref 5–15)
BUN: 8 mg/dL (ref 6–20)
CHLORIDE: 103 mmol/L (ref 101–111)
CO2: 29 mmol/L (ref 22–32)
Calcium: 9.3 mg/dL (ref 8.9–10.3)
Creatinine, Ser: 0.83 mg/dL (ref 0.61–1.24)
GFR calc Af Amer: >60 mL/min (ref >60)
GFR calc non Af Amer: >60 mL/min (ref >60)
GLUCOSE: 82 mg/dL (ref 65–99)
POTASSIUM: 3.4 mmol/L — AB (ref 3.5–5.1)
Sodium: 139 mmol/L (ref 135–145)
Total Bilirubin: 0.7 mg/dL (ref 0.3–1.2)
Total Protein: 8.2 g/dL — ABNORMAL HIGH (ref 6.5–8.1)

## 2016-03-23 LAB — LIPASE, BLOOD: LIPASE: 18 U/L (ref 11–51)

## 2016-03-23 MED ORDER — SUCRALFATE 1 G PO TABS: 1.0000 g | ORAL_TABLET | Freq: Four times a day (QID) | ORAL | 0 refills | Status: DC

## 2016-03-23 MED ORDER — GI COCKTAIL ~~LOC~~
30.0000 mL | Freq: Once | ORAL | Status: AC
  Administered 2016-03-23: 30 mL via ORAL
  Filled 2016-03-23: qty 30

## 2016-03-23 MED ORDER — FAMOTIDINE 20 MG PO TABS: 20.0000 mg | ORAL_TABLET | Freq: Two times a day (BID) | ORAL | 0 refills | Status: DC

## 2016-03-23 MED ORDER — SODIUM CHLORIDE 0.9 % IV BOLUS (SEPSIS)
2000.0000 mL | Freq: Once | INTRAVENOUS | Status: AC
  Administered 2016-03-23: 2000 mL via INTRAVENOUS

## 2016-03-23 MED ORDER — FAMOTIDINE 20 MG PO TABS
40.0000 mg | ORAL_TABLET | Freq: Once | ORAL | Status: AC
  Administered 2016-03-23: 40 mg via ORAL
  Filled 2016-03-23: qty 2

## 2016-03-23 MED ORDER — SUCRALFATE 1 G PO TABS
1.0000 g | ORAL_TABLET | Freq: Once | ORAL | Status: AC
  Administered 2016-03-23: 1 g via ORAL
  Filled 2016-03-23: qty 1

## 2016-03-23 NOTE — ED Provider Notes (Signed)
[facility]-EMERGENCY DEPT Provider Note   CSN: [id number] Arrival date & time: 03/23/16  1848     History   Chief Complaint Chief Complaint  Patient presents with  . Abdominal Pain  . Emesis    HPI Stanley Moon is a 26 y.o. male.  26 year old male resents with a week history of upper abdominal pain characterizes burning and associated with emesis. Denies any hematemesis. No fever or chills. Does have a history of anorexia and has had some recent weight loss. Patient denies any increased anxiety or depression. No suicidal or homicidal ideations. I did speak with the patient's mother outside the room and she states that he has been battling anorexia for quite some time but now recently loss is new. She she confirms that he has never been suicidal or homicidal. She denies abusing any illicit drugs. Denies any self inducing vomiting. No dark or bloody stools. Abdominal discomfort does radiate to his back. Not associated urinary symptoms. Symptoms did get worse after he eats and no treatment use prior to arrival.      Past Medical History:  Diagnosis Date  . Anorexia   . Anxiety   . Asthma    as a child, "I haven't used inhalers in years"  . Depression   . Eczema   . Syphilis 08/29/2013    Patient Active Problem List   Diagnosis Date Noted  . Underweight 12/21/2015  . Anorexia nervosa 11/05/2015    History reviewed. No pertinent surgical history.     Home Medications    Prior to Admission medications   Medication Sig Start Date End Date Taking? Authorizing Provider  aspirin-sod bicarb-citric acid (ALKA-SELTZER) 325 MG TBEF tablet Take 325 mg by mouth every 6 (six) hours as needed (heartburn or nausea).   Yes Historical Provider, MD    Family History History reviewed. No pertinent family history.  Social History Social History  Substance Use Topics  . Smoking status: Never Smoker  . Smokeless tobacco: Never Used  . Alcohol use No     Allergies   Peanuts [peanut  oil] and Lactose intolerance (gi)   Review of Systems Review of Systems  All other systems reviewed and are negative.    Physical Exam Updated Vital Signs BP 119/80 (BP Location: Left Arm)   Pulse (!) 44   Resp 20   Ht 5\' 10"  (1.778 m)   Wt 52.6 kg   SpO2 100%   BMI 16.64 kg/m   Physical Exam  Constitutional: He is oriented to person, place, and time. He appears well-developed and well-nourished.  Non-toxic appearance. No distress.  HENT:  Head: Normocephalic and atraumatic.  Eyes: Conjunctivae, EOM and lids are normal. Pupils are equal, round, and reactive to light.  Neck: Normal range of motion. Neck supple. No tracheal deviation present. No thyroid mass present.  Cardiovascular: Normal rate, regular rhythm and normal heart sounds.  Exam reveals no gallop.   No murmur heard. Pulmonary/Chest: Effort normal and breath sounds normal. No stridor. No respiratory distress. He has no decreased breath sounds. He has no wheezes. He has no rhonchi. He has no rales.  Abdominal: Soft. Normal appearance and bowel sounds are normal. He exhibits no distension. There is no tenderness. There is no rigidity, no rebound, no guarding and no CVA tenderness.  Musculoskeletal: Normal range of motion. He exhibits no edema or tenderness.  Neurological: He is alert and oriented to person, place, and time. He has normal strength. No cranial nerve deficit or sensory deficit. GCS eye  subscore is 4. GCS verbal subscore is 5. GCS motor subscore is 6.  Skin: Skin is warm and dry. No abrasion and no rash noted.  Psychiatric: His affect is labile. His speech is rapid and/or pressured. He is agitated. He expresses no suicidal plans and no homicidal plans.  Nursing note and vitals reviewed.    ED Treatments / Results  Labs (all labs ordered are listed, but only abnormal results are displayed) Labs Reviewed  LIPASE, BLOOD  COMPREHENSIVE METABOLIC PANEL  CBC  URINALYSIS, ROUTINE W REFLEX MICROSCOPIC     EKG  EKG Interpretation None       Radiology No results found.  Procedures Procedures (including critical care time)  Medications Ordered in ED Medications - No data to display   Initial Impression / Assessment and Plan / ED Course  I have reviewed the triage vital signs and the nursing notes.  Pertinent labs & imaging results that were available during my care of the patient were reviewed by me and considered in my medical decision making (see chart for details).     Patient treated for gastritis and feels better. Labs reassuring. Stable for discharge  Final Clinical Impressions(s) / ED Diagnoses   Final diagnoses:  None    New Prescriptions New Prescriptions   No medications on file     Lorre NickAnthony Lesta Limbert, MD 03/23/16 2236

## 2016-03-23 NOTE — ED Triage Notes (Signed)
Patient"s mother reports that he has been off of his psych meds since 10/17. Patient today is very agitated and anxious. Patient c/o upper and mid abdominal pain and emesis x 7 days Patient's mother reports that he has a history of anorexia

## 2016-06-10 ENCOUNTER — Emergency Department (HOSPITAL_COMMUNITY): Payer: Self-pay

## 2016-06-10 ENCOUNTER — Inpatient Hospital Stay (HOSPITAL_COMMUNITY)
Admission: EM | Admit: 2016-06-10 | Discharge: 2016-06-12 | DRG: 883 | Disposition: A | Discharge status: Home / Self Care | Payer: Self-pay | Attending: Internal Medicine | Admitting: Internal Medicine

## 2016-06-10 ENCOUNTER — Encounter: Payer: Self-pay | Admitting: Family Medicine

## 2016-06-10 ENCOUNTER — Encounter (HOSPITAL_COMMUNITY): Payer: Self-pay

## 2016-06-10 ENCOUNTER — Ambulatory Visit: Payer: Self-pay | Attending: Family Medicine | Admitting: Family Medicine

## 2016-06-10 ENCOUNTER — Other Ambulatory Visit: Payer: Self-pay

## 2016-06-10 VITALS — BP 94/57 | HR 43 | Temp 97.5°F | Ht 70.0 in | Wt 102.4 lb

## 2016-06-10 DIAGNOSIS — R188 Other ascites: Secondary | ICD-10-CM | POA: Diagnosis present

## 2016-06-10 DIAGNOSIS — F5000 Anorexia nervosa, unspecified: Secondary | ICD-10-CM | POA: Diagnosis present

## 2016-06-10 DIAGNOSIS — Z0000 Encounter for general adult medical examination without abnormal findings: Secondary | ICD-10-CM

## 2016-06-10 DIAGNOSIS — I959 Hypotension, unspecified: Secondary | ICD-10-CM | POA: Diagnosis present

## 2016-06-10 DIAGNOSIS — A539 Syphilis, unspecified: Secondary | ICD-10-CM | POA: Diagnosis present

## 2016-06-10 DIAGNOSIS — R945 Abnormal results of liver function studies: Secondary | ICD-10-CM

## 2016-06-10 DIAGNOSIS — D61818 Other pancytopenia: Secondary | ICD-10-CM | POA: Diagnosis present

## 2016-06-10 DIAGNOSIS — F419 Anxiety disorder, unspecified: Secondary | ICD-10-CM | POA: Diagnosis present

## 2016-06-10 DIAGNOSIS — E739 Lactose intolerance, unspecified: Secondary | ICD-10-CM | POA: Insufficient documentation

## 2016-06-10 DIAGNOSIS — J45909 Unspecified asthma, uncomplicated: Secondary | ICD-10-CM | POA: Diagnosis present

## 2016-06-10 DIAGNOSIS — Z590 Homelessness: Secondary | ICD-10-CM

## 2016-06-10 DIAGNOSIS — R7989 Other specified abnormal findings of blood chemistry: Secondary | ICD-10-CM | POA: Diagnosis present

## 2016-06-10 DIAGNOSIS — Z91010 Allergy to peanuts: Secondary | ICD-10-CM

## 2016-06-10 DIAGNOSIS — R630 Anorexia: Secondary | ICD-10-CM | POA: Insufficient documentation

## 2016-06-10 DIAGNOSIS — R627 Adult failure to thrive: Secondary | ICD-10-CM | POA: Diagnosis present

## 2016-06-10 DIAGNOSIS — Z681 Body mass index (BMI) 19 or less, adult: Secondary | ICD-10-CM

## 2016-06-10 DIAGNOSIS — F32A Depression, unspecified: Secondary | ICD-10-CM

## 2016-06-10 DIAGNOSIS — E876 Hypokalemia: Secondary | ICD-10-CM | POA: Diagnosis present

## 2016-06-10 DIAGNOSIS — R001 Bradycardia, unspecified: Secondary | ICD-10-CM | POA: Insufficient documentation

## 2016-06-10 DIAGNOSIS — R600 Localized edema: Secondary | ICD-10-CM

## 2016-06-10 DIAGNOSIS — F1290 Cannabis use, unspecified, uncomplicated: Secondary | ICD-10-CM

## 2016-06-10 DIAGNOSIS — L02223 Furuncle of chest wall: Secondary | ICD-10-CM

## 2016-06-10 DIAGNOSIS — F1210 Cannabis abuse, uncomplicated: Secondary | ICD-10-CM | POA: Diagnosis present

## 2016-06-10 DIAGNOSIS — R609 Edema, unspecified: Secondary | ICD-10-CM

## 2016-06-10 DIAGNOSIS — F5001 Anorexia nervosa, restricting type: Principal | ICD-10-CM | POA: Diagnosis present

## 2016-06-10 DIAGNOSIS — E43 Unspecified severe protein-calorie malnutrition: Secondary | ICD-10-CM | POA: Diagnosis present

## 2016-06-10 DIAGNOSIS — K5900 Constipation, unspecified: Secondary | ICD-10-CM | POA: Insufficient documentation

## 2016-06-10 DIAGNOSIS — R636 Underweight: Secondary | ICD-10-CM | POA: Diagnosis present

## 2016-06-10 DIAGNOSIS — F329 Major depressive disorder, single episode, unspecified: Secondary | ICD-10-CM | POA: Diagnosis present

## 2016-06-10 DIAGNOSIS — Z7982 Long term (current) use of aspirin: Secondary | ICD-10-CM | POA: Insufficient documentation

## 2016-06-10 DIAGNOSIS — K2970 Gastritis, unspecified, without bleeding: Secondary | ICD-10-CM | POA: Diagnosis present

## 2016-06-10 LAB — COMPREHENSIVE METABOLIC PANEL
ALT: 144 U/L — ABNORMAL HIGH (ref 17–63)
ANION GAP: 7 (ref 5–15)
AST: 128 U/L — ABNORMAL HIGH (ref 15–41)
Albumin: 3.9 g/dL (ref 3.5–5.0)
Alkaline Phosphatase: 39 U/L (ref 38–126)
BUN: 27 mg/dL — ABNORMAL HIGH (ref 6–20)
CHLORIDE: 105 mmol/L (ref 101–111)
CO2: 26 mmol/L (ref 22–32)
CREATININE: 0.94 mg/dL (ref 0.61–1.24)
Calcium: 9 mg/dL (ref 8.9–10.3)
Glucose, Bld: 83 mg/dL (ref 65–99)
Potassium: 3.6 mmol/L (ref 3.5–5.1)
SODIUM: 138 mmol/L (ref 135–145)
Total Bilirubin: 0.5 mg/dL (ref 0.3–1.2)
Total Protein: 6.6 g/dL (ref 6.5–8.1)

## 2016-06-10 LAB — MRSA PCR SCREENING: MRSA by PCR: NEGATIVE

## 2016-06-10 LAB — ACETAMINOPHEN LEVEL

## 2016-06-10 LAB — RAPID URINE DRUG SCREEN, HOSP PERFORMED
AMPHETAMINES: NOT DETECTED
Barbiturates: NOT DETECTED
Benzodiazepines: NOT DETECTED
COCAINE: NOT DETECTED
OPIATES: NOT DETECTED
TETRAHYDROCANNABINOL: POSITIVE — AB

## 2016-06-10 LAB — CBC
HCT: 38 % — ABNORMAL LOW (ref 39.0–52.0)
HEMOGLOBIN: 12.8 g/dL — AB (ref 13.0–17.0)
MCH: 29.3 pg (ref 26.0–34.0)
MCHC: 33.7 g/dL (ref 30.0–36.0)
MCV: 87 fL (ref 78.0–100.0)
PLATELETS: 153 10*3/uL (ref 150–400)
RBC: 4.37 MIL/uL (ref 4.22–5.81)
RDW: 13.4 % (ref 11.5–15.5)
WBC: 2.4 10*3/uL — ABNORMAL LOW (ref 4.0–10.5)

## 2016-06-10 LAB — TSH: TSH: 1.023 u[IU]/mL (ref 0.350–4.500)

## 2016-06-10 LAB — PREALBUMIN: PREALBUMIN: 21.7 mg/dL (ref 18–38)

## 2016-06-10 LAB — SALICYLATE LEVEL

## 2016-06-10 LAB — I-STAT TROPONIN, ED: TROPONIN I, POC: 0 ng/mL (ref 0.00–0.08)

## 2016-06-10 LAB — ETHANOL

## 2016-06-10 LAB — BRAIN NATRIURETIC PEPTIDE: B NATRIURETIC PEPTIDE 5: 37.2 pg/mL (ref 0.0–100.0)

## 2016-06-10 LAB — MAGNESIUM: Magnesium: 2.1 mg/dL (ref 1.7–2.4)

## 2016-06-10 MED ORDER — ENOXAPARIN SODIUM 40 MG/0.4ML ~~LOC~~ SOLN
40.0000 mg | SUBCUTANEOUS | Status: DC
  Administered 2016-06-10 – 2016-06-11 (×2): 40 mg via SUBCUTANEOUS
  Filled 2016-06-10 (×3): qty 0.4

## 2016-06-10 MED ORDER — ACETAMINOPHEN 650 MG RE SUPP: 650.0000 mg | Freq: Four times a day (QID) | RECTAL | Status: DC | PRN

## 2016-06-10 MED ORDER — ACETAMINOPHEN 325 MG PO TABS: 650.0000 mg | ORAL_TABLET | Freq: Four times a day (QID) | ORAL | Status: DC | PRN

## 2016-06-10 MED ORDER — SULFAMETHOXAZOLE-TRIMETHOPRIM 400-80 MG PO TABS: 1.0000 | ORAL_TABLET | Freq: Two times a day (BID) | ORAL | 0 refills | Status: DC

## 2016-06-10 MED ORDER — SODIUM CHLORIDE 0.9 % IV SOLN
INTRAVENOUS | Status: DC
  Administered 2016-06-10 – 2016-06-12 (×3): via INTRAVENOUS

## 2016-06-10 MED ORDER — POTASSIUM CHLORIDE CRYS ER 20 MEQ PO TBCR: 40.0000 meq | EXTENDED_RELEASE_TABLET | Freq: Once | ORAL | Status: DC

## 2016-06-10 NOTE — Patient Instructions (Addendum)
Stanley Moon was seen today for annual exam.  Diagnoses and all orders for this visit:  Anorexia nervosa -     CBC -     CMP14+EGFR -     EKG 12-Lead  Healthcare maintenance -     Ambulatory referral to Dentistry  Furuncle of chest wall -     sulfamethoxazole-trimethoprim (BACTRIM) 400-80 MG tablet; Take 1 tablet by mouth 2 (two) times daily.    I have called over to [facility] ED, You will check into triage and be admitted to ED They are expecting you Please bring the copy of your EKG  f/u in 2 weeks or when first available based on anorexia treatment plan   Dr. Adrian Blackwater

## 2016-06-10 NOTE — BH Assessment (Addendum)
Assessment Note  Stanley Moon is an 26 y.o. male with history of Anorexia, anxiety, and depression. He presents to [facility], voluntarily. Referred to [facility] by his PCP for medical clearance. Patient was later served IVC papers taken out by his mother Araceli Coufal [phone]). The IVC reads: "Respondent is a 26 year old males. 5'10" and weighs 102 LBS. He is diagnosed with anorexia nervosa and depression and also uses marijuana. He may eat one meal per day but when he cooks, it's in the dark. He also bathes in the dark. He doesn't sleep well. 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 Petitioner sts respondents moods are "Flat". Nothing seems to provoke any emotion from him. He doesn't think anything is wrong with him or his extreme weight loss. When he was committed last year, Dr. Mckinley Jewel stte that if he was "Not able to maintain adequate nutritional intake it is my opinion as his psychiatrist that he should be petitioned for IVC.Marland KitchenMarland KitchenMarland KitchenAs soon as possible". He is a danger to self."  Patient denies suicidal ideations. He denies history of suicide attempts and/or gestures. He denies self mutilating behaviors. He does admit to depression. He tends to isolate himself from others and sometimes feels hopeless. He denies current stressors. However explains that he was recently homeless and that was a major stressor for him. He is now living with grandmother and feels hopeful about his future. He admits that he has battled with anorexia. Sts that he is trying to work on his appetite but understands this will take some time. Patient explains that while he was homeless he was unable to eat properly. Sts, "It wasn't a choice..I just didn't have a refrigerator, stove, microwave...Marland Kitchenno way to preserve food". Patient does eat but admits that he doesn't eat a lot. He typically eats one meal a day and is trying to increase his meal intake. Patient admits that he showers in the dark and also cooks  in the dark.  Sts, "I'm not hurting anyone it's just what I do sometimes". He denies HI. He has no history of aggression or assaultive behaviors. He denies alcohol use. He does admit to regular THC use. Patient has received services at [facility] in the past. Last visit to [facility] was 02/2016. He was admitted to [facility] Oct 1-12, 2017 for depression and anorexia.   Diagnosis: Depression and Anxiety  Past Medical History:  Past Medical History:  Diagnosis Date  . Anorexia   . Anxiety   . Asthma    as a child, "I haven't used inhalers in years"  . Depression   . Eczema   . Syphilis 08/29/2013    Past Surgical History:  Procedure Laterality Date  . EXTERNAL EAR SURGERY      Family History: History reviewed. No pertinent family history.  Social History:  reports that he has never smoked. He has never used smokeless tobacco. He reports that he does not drink alcohol or use drugs.  Additional Social History:  Alcohol / Drug Use Pain Medications: Denies use Prescriptions: Denies use Over the Counter: Denies abuse History of alcohol / drug use?: Yes Longest period of sobriety (when/how long): NA Withdrawal Symptoms: Other (Comment) Substance #1 Name of Substance 1: THC 1 - Age of First Use: teens 1 - Amount (size/oz): varies 1 - Frequency: "almost everyday" 1 - Duration: on-going  1 - Last Use / Amount: 06/08/2016  CIWA: CIWA-Ar BP: (!) 96/56 Pulse Rate: (!) 35 COWS:  Allergies:  Allergies  Allergen Reactions  . Peanuts [Peanut Oil] Anaphylaxis  . Lactose Intolerance (Gi) Nausea And Vomiting    Home Medications:  (Not in a hospital admission)  OB/GYN Status:  No LMP for male patient.  General Assessment Data Location of Assessment: [facility] TTS Assessment: In system Is this a Tele or Face-to-Face Assessment?: Face-to-Face Is this an Initial Assessment or a Re-assessment for this encounter?: Initial Assessment Marital status: Single Maiden name:  (n/a) Is patient  pregnant?: No Pregnancy Status: No Living Arrangements: Other (Comment) (lives with grandmother ) Can pt return to current living arrangement?: Yes Admission Status: Involuntary Is patient capable of signing voluntary admission?: Yes Referral Source: Self/Family/Friend Insurance type:  (self pay )     Crisis Care Plan Living Arrangements: Other (Comment) (lives with grandmother ) Legal Guardian: Other: (no legal guardian ) Name of Psychiatrist:  Museum/gallery curator ) Name of Therapist:  Museum/gallery curator )  Education Status Is patient currently in school?: No Current Grade:  (n/a) Highest grade of school patient has completed:  (HS ) Name of school:  (n/a) Contact person:  (n/a)  Risk to self with the past 6 months Suicidal Ideation: No Has patient been a risk to self within the past 6 months prior to admission? : No Suicidal Intent: No Has patient had any suicidal intent within the past 6 months prior to admission? : No Is patient at risk for suicide?: No Suicidal Plan?: No Has patient had any suicidal plan within the past 6 months prior to admission? : No Access to Means: No What has been your use of drugs/alcohol within the last 12 months?:  (thc ) Previous Attempts/Gestures: No How many times?:  (0) Other Self Harm Risks:  (no self harm risk; no hx of self injurious behaviors ) Triggers for Past Attempts:  (no past attempts or gestures ) Intentional Self Injurious Behavior: None Family Suicide History: Unknown Recent stressful life event(s): Other (Comment) (no stressors reported ) Persecutory voices/beliefs?: No Depression: Yes Depression Symptoms: Isolating, Loss of interest in usual pleasures Substance abuse history and/or treatment for substance abuse?: No Suicide prevention information given to non-admitted patients: Not applicable  Risk to Others within the past 6 months Homicidal Ideation: No Does patient have any lifetime risk of violence toward others beyond the six months  prior to admission? : No Thoughts of Harm to Others: No Current Homicidal Intent: No Current Homicidal Plan: No Access to Homicidal Means: No Identified Victim:  (n/a) History of harm to others?: No Assessment of Violence: None Noted Violent Behavior Description:  (patient is calm and cooperative ) Does patient have access to weapons?: No Criminal Charges Pending?: No Does patient have a court date: No Is patient on probation?: No  Psychosis Hallucinations: None noted Delusions: None noted  Mental Status Report Appearance/Hygiene: In scrubs Eye Contact: Good Motor Activity: Freedom of movement Speech: Logical/coherent Level of Consciousness: Alert Mood: Other (Comment) (appropriate to circumstance; mood is appropriate ) Affect: Appropriate to circumstance Anxiety Level: Minimal Thought Processes: Relevant Judgement: Unimpaired Orientation: Time, Situation, Place, Person Obsessive Compulsive Thoughts/Behaviors: None  Cognitive Functioning Concentration: Decreased Memory: Recent Intact, Remote Intact IQ: Average Insight: Good Impulse Control: Fair Appetite: Fair Weight Loss:  (pt reports wt loss; weighs 102) Weight Gain:  (none reported ) Sleep: Decreased Total Hours of Sleep:  (varies ) Vegetative Symptoms: None  ADLScreening [facility] Assessment Services) Patient's cognitive ability adequate to safely complete daily activities?: Yes Patient able to express need for assistance with ADLs?: Yes  Independently performs ADLs?: Yes (appropriate for developmental age)  Prior Inpatient Therapy Prior Inpatient Therapy: Yes Prior Therapy Dates:  [facility] October 1-12, 2017) Prior Therapy Facilty/Provider(s):  [facility]) Reason for Treatment:  (depression and anorexia )  Prior Outpatient Therapy Prior Outpatient Therapy: Yes Prior Therapy Dates:  ([facility]) Prior Therapy Facilty/Provider(s):  [facility] ) Reason for Treatment:  (medication managment ) Does patient  have an ACCT team?: No Does patient have Intensive In-House Services?  : No Does patient have [facility] services? : No Does patient have P4CC services?: No  ADL Screening (condition at time of admission) Patient's cognitive ability adequate to safely complete daily activities?: Yes Is the patient deaf or have difficulty hearing?: No Does the patient have difficulty seeing, even when wearing glasses/contacts?: No Does the patient have difficulty concentrating, remembering, or making decisions?: No Patient able to express need for assistance with ADLs?: Yes Does the patient have difficulty dressing or bathing?: No Independently performs ADLs?: Yes (appropriate for developmental age) Does the patient have difficulty walking or climbing stairs?: No Weakness of Legs: None Weakness of Arms/Hands: None  Home Assistive Devices/Equipment Home Assistive Devices/Equipment: None    Abuse/Neglect Assessment (Assessment to be complete while patient is alone) Physical Abuse: Denies Verbal Abuse: Denies Sexual Abuse: Denies Exploitation of patient/patient's resources: Denies Self-Neglect: Denies Values / Beliefs Cultural Requests During Hospitalization: None Spiritual Requests During Hospitalization: None   Advance Directives (For Healthcare) Does Patient Have a Medical Advance Directive?: No Would patient like information on creating a medical advance directive?: No - Patient declined Nutrition Screen- [facility] Patient's home diet: Regular  Additional Information 1:1 In Past 12 Months?: No CIRT Risk: No Elopement Risk: No Does patient have medical clearance?: Yes     Disposition:  Disposition Initial Assessment Completed for this Encounter: Yes Disposition of Patient: Other dispositions (Per Renata Capriceonrad, pending am psych evaluation; overnight observati) Other disposition(s): Information only, Other (Comment) (Pending am psych evaluation )  On Site Evaluation by:   Reviewed with  Physician:    Melynda Rippleoyka Adream Parzych 06/10/2016 4:43 PM

## 2016-06-10 NOTE — Assessment & Plan Note (Signed)
Bactrim course

## 2016-06-10 NOTE — Progress Notes (Signed)
Patient had a verbal altercation with his younger brother  while he was visiting him tonight. RN had to advise the brother to leave or have to call security and [employer]. After much back and forth, the brother left the unit with his mother. Security personnel did not have to intervene. Patient has requested that the brother Stanley Haber( Stanley Moon) not be allowed to visit him while in the hospital. Will relay this info. to the day shift RN.

## 2016-06-10 NOTE — Assessment & Plan Note (Addendum)
Anorexia with weight loss Bradycardia, hypotension Risk for sudden cardiac death and syncope  Patient transferred to ED via private vehicle for electrolyte check, IV fluids if needed and psych evaluation  In patient treatment recommended

## 2016-06-10 NOTE — ED Notes (Signed)
[employer] brought IVC papers 

## 2016-06-10 NOTE — Progress Notes (Signed)
SUBJECTIVE:  Stanley Moon is a 26 y.o. male presenting for his annual checkup. He comes today with his mother and father who are both primarily concerned about his weight.   1. Anorexia: currently not seeing a therapist. Currently not taking medication. He denies dizziness, lightheadedness, fatigue, palpitations. He reports abdominal discomfort at time, citing gastritis. He denis nausea and emesis. He admits to constipation. He reports he does not eat fried foods, cookies, crackers, potato chips, microwave meals. He is eating nutrigrain bars, granola bars, oatmeal, wheat bread. He is drinking water. He also drinks apple juice. He avoid citrus foods, he eliminated spicy foods. He typically skips breakfast. He drank a little water when brushing his teeth.   24 hr food recall:  mixed vegetable, fish fillet sticks, and mini cheese pizza. He drink 3-4, 8.5 oz of water per day. He did not eat anything else that. This is a typical 24 hr intake. He does have ensure with his meal some days.   He eats a lot of beans He does not eat pork or chicken.   In the past week he denies not eating for 24 hrs or more.  He is not exercising currently but admits to exercising on treadmill 2-3 miles per day lifting weights within the last month.  He walks for transportation. He walks 5 miles per day.   He works in a [employer]. He started in 01/2016.   He reports his mood is upbeat in general. He repots feeling down and depressed intermittently.   He currently lives with grandmother and brother for the past 6 weeks or so, prior to this he lived in a motel, prior to this he lived with his mother. His father lives in [place]. His mother lives in [place].    2. Dental problem: he reports not seeing a dentist in 3-4 years. He denies pain in his teeth. He requet referral for dental cleaning.   Social History  Substance Use Topics  . Smoking status: Never Smoker  . Smokeless tobacco: Never Used  . Alcohol use  No   Current Outpatient Prescriptions  Medication Sig Dispense Refill  . aspirin-sod bicarb-citric acid (ALKA-SELTZER) 325 MG TBEF tablet Take 325 mg by mouth every 6 (six) hours as needed (heartburn or nausea).    . famotidine (PEPCID) 20 MG tablet Take 1 tablet (20 mg total) by mouth 2 (two) times daily. 30 tablet 0  . sucralfate (CARAFATE) 1 g tablet Take 1 tablet (1 g total) by mouth 4 (four) times daily. 30 tablet 0   No current facility-administered medications for this visit.    Allergies: Peanuts [peanut oil] and Lactose intolerance (gi)   ROS:  Feeling well. No dyspnea or chest pain on exertion. No abdominal pain, change in bowel habits, black or bloody stools. No urinary tract or prostatic symptoms. No neurological complaints.  OBJECTIVE:  The patient appears well, alert, oriented x 3, in no distress.  BP (!) 94/57   Pulse (!) 43   Temp 97.5 F (36.4 C) (Oral)   Ht 5\' 10"  (1.778 m)   Wt 102 lb 6.4 oz (46.4 kg)   SpO2 100%   BMI 14.69 kg/m   Wt Readings from Last 3 Encounters:  06/10/16 102 lb 6.4 oz (46.4 kg)  03/23/16 116 lb (52.6 kg)  01/11/16 117 lb 9.6 oz (53.3 kg)    Extremely thin with temporal wasting. ENT normal.  Neck supple. No adenopathy or thyromegaly. PERLA. Lungs are clear, good air entry, no wheezes, rhonchi  or rales. S1 and S2 marked bradycardia.  no murmurs, regular rhythm. Abdomen is soft without tenderness, guarding, mass or organomegaly. .  Extremities show 1 + edema in lower legs.  normal peripheral pulses. Neurological is normal without focal findings. SKIN: erythematous papule mid chest, slightly tender 5-10 mm in width   EKG: sinus bradycardia. HR 41.   Assessment and Plan: 26 yo M with anorexia  Losing weight At risk of syncope and sudden cardiac death

## 2016-06-10 NOTE — ED Triage Notes (Signed)
Patient was sent from PCP's office today for anorexia. An EKG was done while there. Patient's family reported that the mother went to have IVC papers drawn for the patient.

## 2016-06-10 NOTE — H&P (Signed)
[facility] History and Physical  Tytus Strahle BJY:[id number] DOB: [birth year] DOA: 06/10/2016  PCP: Dessa Phi, MD  Patient coming from: PCP's office/home  Chief Complaint: Bradycardia  HPI: Stanley Moon is a 26 y.o. male with a medical history of anorexia who presented to the ER from his primary care provider's office for weight loss, low blood pressure, failure to thrive. Patient has had weight loss issues in the past due to constraints of financial situations and being homeless. Patient was able to obtain food but not able to prepare her preserved food. He also states that he has been very limited with his diet due to his gastritis by avoiding spicy foods and certain fruits. Patient states that he has been following up with a nutritionist and they have come up with a diet which she tries to follow. He admits to eating vegetables, pizza, chicken and fish, granola etc. He no longer eats cookies and chips. Patient feels he has been unable to eat all meals due to his current job. Since having a job, patient states he has been doing better and feeling better. Patient currently has been IVCed by his family. This also occurred last your when patient had similar presentation.   ED Course: Found to have a heart rate as low as 37. [facility] called for admission.  Review of Systems:  All other systems reviewed and are negative.   Past Medical History:  Diagnosis Date  . Anorexia   . Anxiety   . Asthma    as a child, "I haven't used inhalers in years"  . Depression   . Eczema   . Syphilis 08/29/2013    Past Surgical History:  Procedure Laterality Date  . EXTERNAL EAR SURGERY      Social History:  reports that he has never smoked. He has never used smokeless tobacco. He reports that he does not drink alcohol or use drugs.  Allergies  Allergen Reactions  . Peanuts [Peanut Oil] Anaphylaxis  . Lactose Intolerance (Gi) Nausea And Vomiting   Family History History reviewed. No  pertinent family history.  Per patient, mother had digestive issues. No history of diabetes, coronary artery disease, hypertension.  Prior to Admission medications   Medication Sig Start Date End Date Taking? Authorizing Provider  sulfamethoxazole-trimethoprim (BACTRIM) 400-80 MG tablet Take 1 tablet by mouth 2 (two) times daily. 06/10/16   Dessa Phi, MD    Physical Exam: Vitals:   06/10/16 1438 06/10/16 1649  BP: (!) 96/56 102/65  Pulse: (!) 35 (!) 37  Resp: 18 18  Temp: 97.6 F (36.4 C)      General: Well developed, cachetic, NAD, appears stated age  HEENT: NCAT, PERRLA, EOMI, Anicteic Sclera, mucous membranes moist.   Neck: Supple, no JVD, no masses  Cardiovascular: S1 S2 auscultated, no rubs, murmurs or gallops. Bradycardic   Respiratory: Clear to auscultation bilaterally with equal chest rise  Abdomen: Soft, nontender, nondistended, + bowel sounds  Extremities: warm dry without cyanosis clubbing. LE edema B/L   Neuro: AAOx3, cranial nerves grossly intact. Strength 5/5 in patient's upper and lower extremities bilaterally  Skin: Without rashes exudates or nodules  Psych: Appropriate mood and affect  Labs on Admission: I have personally reviewed following labs and imaging studies CBC:  Recent Labs Lab 06/10/16 1256  WBC 2.4*  HGB 12.8*  HCT 38.0*  MCV 87.0  PLT 153   Basic Metabolic Panel:  Recent Labs Lab 06/10/16 1256  NA 138  K 3.6  CL 105  CO2 26  GLUCOSE 83  BUN 27*  CREATININE 0.94  CALCIUM 9.0  MG 2.1   GFR: Estimated Creatinine Clearance: 79.5 mL/min (by C-G formula based on SCr of 0.94 mg/dL). Liver Function Tests:  Recent Labs Lab 06/10/16 1256  AST 128*  ALT 144*  ALKPHOS 39  BILITOT 0.5  PROT 6.6  ALBUMIN 3.9   No results for input(s): LIPASE, AMYLASE in the last 168 hours. No results for input(s): AMMONIA in the last 168 hours. Coagulation Profile: No results for input(s): INR, PROTIME in the last 168  hours. Cardiac Enzymes: No results for input(s): CKTOTAL, CKMB, CKMBINDEX, TROPONINI in the last 168 hours. BNP (last 3 results) No results for input(s): PROBNP in the last 8760 hours. HbA1C: No results for input(s): HGBA1C in the last 72 hours. CBG: No results for input(s): GLUCAP in the last 168 hours. Lipid Profile: No results for input(s): CHOL, HDL, LDLCALC, TRIG, CHOLHDL, LDLDIRECT in the last 72 hours. Thyroid Function Tests: No results for input(s): TSH, T4TOTAL, FREET4, T3FREE, THYROIDAB in the last 72 hours. Anemia Panel: No results for input(s): VITAMINB12, FOLATE, FERRITIN, TIBC, IRON, RETICCTPCT in the last 72 hours. Urine analysis:    Component Value Date/Time   COLORURINE STRAW (A) 03/23/2016 2142   APPEARANCEUR CLEAR 03/23/2016 2142   LABSPEC 1.005 03/23/2016 2142   PHURINE 7.0 03/23/2016 2142   GLUCOSEU NEGATIVE 03/23/2016 2142   HGBUR NEGATIVE 03/23/2016 2142   BILIRUBINUR NEGATIVE 03/23/2016 2142   KETONESUR NEGATIVE 03/23/2016 2142   PROTEINUR NEGATIVE 03/23/2016 2142   NITRITE NEGATIVE 03/23/2016 2142   LEUKOCYTESUR NEGATIVE 03/23/2016 2142   Sepsis Labs: @LABRCNTIP (procalcitonin:4,lacticidven:4) )No results found for this or any previous visit (from the past 240 hour(s)).   Radiological Exams on Admission: No results found.  EKG: Independently reviewed. Sinus bradycardia, rate 33  Assessment/Plan  Bradycardia -Currently asymptomatic -Suspect secondary to poor nutritional status -Will obtain Echocardiogram, TSH, FT4  -Potassium 3.6, will replace with supplementation -Magnesium 2.1 -Will admit to stepdown for close monitoring  Mild hypotension -patient has had normal BP in the past -BP was 96/56, will place on IVF  Anorexia/Failure to thrive -Patient was seeing a nutritionist, has had a long standing issue with diet and gastritis  -Psych consulted and appreciated -Will obtain TSH -Nutrition consult -will obtain prealbumin, Albumin  currently 3.9 -Will place on IVF -Currently IVCed  -Patient feels he eats enough, eats a vegetable at every meal, fish, chicken, etc.   Marijuana Abuse -tox screen + -Discussed cessation  History of gastritis -Tries to control with diet  Lower extremity edema -Likely secondary to poor nutritional status -BNP 37.2 -Plan as above  Elevated LFTs -Will place patient on IVF and continue to monitor  -Possibly due to dehydration  DVT prophylaxis: Lovenox  Code Status: Full  Family Communication: None at bedside. Admission, patients condition and plan of care including tests being ordered have been discussed with the patient, who indicates understanding and agrees with the plan and Code Status.  Disposition Plan: [facility]?   Consults called: Psychiatry   Admission status: Observation   Time spent: 65 minutes  Lidia Clavijo D.O. [facility] Pager [phone]  If 7PM-7AM, please contact night-coverage [email or web address] Password [email or web address] 06/10/2016, 6:03 PM

## 2016-06-10 NOTE — ED Provider Notes (Signed)
[facility]-EMERGENCY DEPT Provider Note   CSN: [id number] Arrival date & time: 06/10/16  1221     History   Chief Complaint Chief Complaint  Patient presents with  . Eating Disorder  . IVC    HPI Carlynn SpryQunzae Devenport is a 26 y.o. male.  The history is provided by the patient.   Sent here from PCP office due to weight loss and low BPs in setting of wt loss due to likely anorexia nervosa.  Pt states that his weight loss is due to financial constraints and situation. He was homeless but still getting food stamps and he states that there was no way to preserve or prepare food.   States he has gastritis and has limited his diet to help his symptoms.  Denies chest pain, SOB, N/V/D, new abd /discomfortpain, dizziness.  3:42 PM Spoke with mother who reports that the patient chooses to only 1 meal a day, work part time. States that he isolates himself from his family and the outside world. He does things in the dark, ie take a shower, cook, eat. States that he has stable home and lives with his GM and he has access to a "full kitchen." She reports that the patient was IVC and admitted last year for the same presentation and reports that he is worse off this time around. They allowed him to follow up outpatient last year which he now refuses to do.  Past Medical History:  Diagnosis Date  . Anorexia   . Anxiety   . Asthma    as a child, "I haven't used inhalers in years"  . Depression   . Eczema   . Syphilis 08/29/2013    Patient Active Problem List   Diagnosis Date Noted  . Furuncle of chest wall 06/10/2016  . Underweight 12/21/2015  . Anorexia nervosa 11/05/2015    Past Surgical History:  Procedure Laterality Date  . EXTERNAL EAR SURGERY         Home Medications    Prior to Admission medications   Medication Sig Start Date End Date Taking? Authorizing Provider  sulfamethoxazole-trimethoprim (BACTRIM) 400-80 MG tablet Take 1 tablet by mouth 2 (two) times daily. 06/10/16   Dessa PhiFunches,  Josalyn, MD    Family History History reviewed. No pertinent family history.  Social History Social History  Substance Use Topics  . Smoking status: Never Smoker  . Smokeless tobacco: Never Used  . Alcohol use No     Allergies   Peanuts [peanut oil] and Lactose intolerance (gi)   Review of Systems Review of Systems All other systems are reviewed and are negative for acute change except as noted in the HPI   Physical Exam Updated Vital Signs BP (!) 96/56 (BP Location: Left Arm)   Pulse (!) 35   Temp 97.6 F (36.4 C) (Oral)   Resp 18   Ht 5\' 10"  (1.778 m)   Wt 102 lb 6.4 oz (46.4 kg)   SpO2 100%   BMI 14.69 kg/m   Physical Exam  Constitutional: He is oriented to person, place, and time. He appears well-developed and well-nourished. No distress.  HENT:  Head: Normocephalic and atraumatic.  Nose: Nose normal.  Eyes: Conjunctivae and EOM are normal. Pupils are equal, round, and reactive to light. Right eye exhibits no discharge. Left eye exhibits no discharge. No scleral icterus.  Neck: Normal range of motion. Neck supple.  Cardiovascular: Normal rate and regular rhythm.  Exam reveals no gallop and no friction rub.   No murmur heard.  Pulmonary/Chest: Effort normal and breath sounds normal. No stridor. No respiratory distress. He has no rales.  Abdominal: Soft. He exhibits no distension. There is no tenderness.  Musculoskeletal: He exhibits no edema or tenderness.  1+ BLE edema  Neurological: He is alert and oriented to person, place, and time.  Skin: Skin is warm and dry. No rash noted. He is not diaphoretic. No erythema.  Psychiatric: He has a normal mood and affect.  Vitals reviewed.    ED Treatments / Results  Labs (all labs ordered are listed, but only abnormal results are displayed) Labs Reviewed  COMPREHENSIVE METABOLIC PANEL - Abnormal; Notable for the following:       Result Value   BUN 27 (*)    AST 128 (*)    ALT 144 (*)    All other components  within normal limits  ACETAMINOPHEN LEVEL - Abnormal; Notable for the following:    Acetaminophen (Tylenol), Serum <10 (*)    All other components within normal limits  CBC - Abnormal; Notable for the following:    WBC 2.4 (*)    Hemoglobin 12.8 (*)    HCT 38.0 (*)    All other components within normal limits  RAPID URINE DRUG SCREEN, HOSP PERFORMED - Abnormal; Notable for the following:    Tetrahydrocannabinol POSITIVE (*)    All other components within normal limits  ETHANOL  SALICYLATE LEVEL  MAGNESIUM  BRAIN NATRIURETIC PEPTIDE  I-STAT TROPOININ, ED    EKG  EKG Interpretation  Date/Time:  Tuesday Jun 10 2016 17:03:17 EDT Ventricular Rate:  33 PR Interval:  180 QRS Duration: 104 QT Interval:  494 QTC Calculation: 365 R Axis:   38 Text Interpretation:  Marked sinus bradycardia Abnormal ECG No significant change since last tracing Confirmed by [facility] MD, PEDRO ([id number]) on 06/10/2016 6:21:29 PM       Radiology Dg Chest 2 View  Result Date: 06/10/2016 CLINICAL DATA:  Abnormal electrocardiogram EXAM: CHEST  2 VIEW COMPARISON:  April 06, 2004 FINDINGS: Lungs are clear. Heart size and pulmonary vascularity are normal. No adenopathy. No bone lesions. IMPRESSION: No edema or consolidation. Electronically Signed   By: Bretta Bang III M.D.   On: 06/10/2016 18:07    Procedures Procedures (including critical care time)  Medications Ordered in ED Medications - No data to display   Initial Impression / Assessment and Plan / ED Course  I have reviewed the triage vital signs and the nursing notes.  Pertinent labs & imaging results that were available during my care of the patient were reviewed by me and considered in my medical decision making (see chart for details).     IVC upheld.  Given a history of depression and anorexia with evidence of lower extremity edema bradycardia is a concern for cardiac sequelae due to anorexia nervosa. EKG obtained and primary care  provider's office revealed sinus bradycardia with no evidence of block. No evidence of acute ischemia or dysrhythmias. Labs obtained at triage without evidence of electrolyte derangements or renal insufficiency. We will add on cardiac markers, repeat EKG, chest x-ray to assess for evidence of cardiomyopathy.   Patient will likely need admission for continued workup and medical clearance.    Final Clinical Impressions(s) / ED Diagnoses   Final diagnoses:  Bradycardia  Peripheral edema  Anorexia nervosa, restricting type      Cardama, Amadeo Garnet, MD 06/10/16 1821

## 2016-06-11 ENCOUNTER — Observation Stay (HOSPITAL_COMMUNITY): Payer: Self-pay

## 2016-06-11 ENCOUNTER — Observation Stay (HOSPITAL_BASED_OUTPATIENT_CLINIC_OR_DEPARTMENT_OTHER): Payer: Self-pay

## 2016-06-11 DIAGNOSIS — R636 Underweight: Secondary | ICD-10-CM

## 2016-06-11 DIAGNOSIS — R609 Edema, unspecified: Secondary | ICD-10-CM

## 2016-06-11 DIAGNOSIS — F1290 Cannabis use, unspecified, uncomplicated: Secondary | ICD-10-CM

## 2016-06-11 DIAGNOSIS — R600 Localized edema: Secondary | ICD-10-CM

## 2016-06-11 DIAGNOSIS — E876 Hypokalemia: Secondary | ICD-10-CM

## 2016-06-11 DIAGNOSIS — F5000 Anorexia nervosa, unspecified: Secondary | ICD-10-CM

## 2016-06-11 DIAGNOSIS — R001 Bradycardia, unspecified: Secondary | ICD-10-CM

## 2016-06-11 DIAGNOSIS — L02223 Furuncle of chest wall: Secondary | ICD-10-CM

## 2016-06-11 DIAGNOSIS — I959 Hypotension, unspecified: Secondary | ICD-10-CM

## 2016-06-11 DIAGNOSIS — R7989 Other specified abnormal findings of blood chemistry: Secondary | ICD-10-CM

## 2016-06-11 DIAGNOSIS — E43 Unspecified severe protein-calorie malnutrition: Secondary | ICD-10-CM | POA: Insufficient documentation

## 2016-06-11 DIAGNOSIS — K2970 Gastritis, unspecified, without bleeding: Secondary | ICD-10-CM

## 2016-06-11 DIAGNOSIS — F1210 Cannabis abuse, uncomplicated: Secondary | ICD-10-CM

## 2016-06-11 DIAGNOSIS — F329 Major depressive disorder, single episode, unspecified: Secondary | ICD-10-CM

## 2016-06-11 DIAGNOSIS — F32A Depression, unspecified: Secondary | ICD-10-CM

## 2016-06-11 LAB — CBC
HCT: 33.8 % — ABNORMAL LOW (ref 39.0–52.0)
Hematocrit: 40.4 % (ref 37.5–51.0)
Hemoglobin: 13.2 g/dL (ref 13.0–17.7)
Hemoglobin: 11.1 g/dL — ABNORMAL LOW (ref 13.0–17.0)
MCH: 29.5 pg (ref 26.6–33.0)
MCH: 28.7 pg (ref 26.0–34.0)
MCHC: 32.7 g/dL (ref 31.5–35.7)
MCHC: 32.8 g/dL (ref 30.0–36.0)
MCV: 90 fL (ref 79–97)
MCV: 87.3 fL (ref 78.0–100.0)
PLATELETS: 169 10*3/uL (ref 150–379)
PLATELETS: 137 10*3/uL — AB (ref 150–400)
RBC: 4.48 x10E6/uL (ref 4.14–5.80)
RBC: 3.87 MIL/uL — AB (ref 4.22–5.81)
RDW: 14.7 % (ref 12.3–15.4)
RDW: 13.4 % (ref 11.5–15.5)
WBC: 3.1 10*3/uL — ABNORMAL LOW (ref 3.4–10.8)
WBC: 2.8 10*3/uL — ABNORMAL LOW (ref 4.0–10.5)

## 2016-06-11 LAB — COMPREHENSIVE METABOLIC PANEL
ALK PHOS: 34 U/L — AB (ref 38–126)
ALT: 117 U/L — AB (ref 17–63)
ANION GAP: 5 (ref 5–15)
AST: 91 U/L — ABNORMAL HIGH (ref 15–41)
Albumin: 3.4 g/dL — ABNORMAL LOW (ref 3.5–5.0)
BUN: 24 mg/dL — ABNORMAL HIGH (ref 6–20)
CALCIUM: 8.5 mg/dL — AB (ref 8.9–10.3)
CHLORIDE: 106 mmol/L (ref 101–111)
CO2: 27 mmol/L (ref 22–32)
CREATININE: 1.07 mg/dL (ref 0.61–1.24)
GFR calc Af Amer: >60 mL/min (ref >60)
Glucose, Bld: 124 mg/dL — ABNORMAL HIGH (ref 65–99)
Potassium: 3.4 mmol/L — ABNORMAL LOW (ref 3.5–5.1)
SODIUM: 138 mmol/L (ref 135–145)
TOTAL PROTEIN: 5.5 g/dL — AB (ref 6.5–8.1)
Total Bilirubin: 0.6 mg/dL (ref 0.3–1.2)

## 2016-06-11 LAB — CMP14+EGFR
A/G RATIO: 1.7 (ref 1.2–2.2)
ALK PHOS: 46 IU/L (ref 39–117)
ALT: 154 IU/L — AB (ref 0–44)
AST: 143 IU/L — AB (ref 0–40)
Albumin: 4.1 g/dL (ref 3.5–5.5)
BILIRUBIN TOTAL: 0.3 mg/dL (ref 0.0–1.2)
BUN/Creatinine Ratio: 22 — ABNORMAL HIGH (ref 9–20)
BUN: 25 mg/dL — AB (ref 6–20)
CHLORIDE: 101 mmol/L (ref 96–106)
CO2: 27 mmol/L (ref 18–29)
Calcium: 9.1 mg/dL (ref 8.7–10.2)
Creatinine, Ser: 1.13 mg/dL (ref 0.76–1.27)
GFR calc Af Amer: 105 mL/min/{1.73_m2} (ref >59)
GFR calc non Af Amer: 90 mL/min/{1.73_m2} (ref >59)
GLUCOSE: 80 mg/dL (ref 65–99)
Globulin, Total: 2.4 g/dL (ref 1.5–4.5)
POTASSIUM: 3.8 mmol/L (ref 3.5–5.2)
Sodium: 142 mmol/L (ref 134–144)
TOTAL PROTEIN: 6.5 g/dL (ref 6.0–8.5)

## 2016-06-11 LAB — ECHOCARDIOGRAM COMPLETE
Height: 70 in
WEIGHTICAEL: 1675.5 [oz_av]

## 2016-06-11 LAB — T4, FREE: FREE T4: 0.76 ng/dL (ref 0.61–1.12)

## 2016-06-11 MED ORDER — POTASSIUM CHLORIDE CRYS ER 20 MEQ PO TBCR
40.0000 meq | EXTENDED_RELEASE_TABLET | Freq: Two times a day (BID) | ORAL | Status: DC
  Administered 2016-06-11 (×2): 40 meq via ORAL
  Filled 2016-06-11 (×3): qty 2

## 2016-06-11 MED ORDER — ADULT MULTIVITAMIN W/MINERALS CH
1.0000 | ORAL_TABLET | Freq: Every day | ORAL | Status: DC
  Administered 2016-06-11 – 2016-06-12 (×2): 1 via ORAL
  Filled 2016-06-11 (×3): qty 1

## 2016-06-11 MED ORDER — ENSURE ENLIVE PO LIQD: 237.0000 mL | Freq: Two times a day (BID) | ORAL | Status: DC

## 2016-06-11 NOTE — Progress Notes (Signed)
Initial Nutrition Assessment  DOCUMENTATION CODES:   Severe malnutrition in context of social or environmental circumstances, Underweight  INTERVENTION:  - Will order Ensure Enlive BID, each supplement provides 350 kcal and 20 grams of protein. - Will order daily multivitamin with minerals. - Continue to encourage PO intakes of meals and supplements. - RD will continue to monitor for additional nutrition-related needs.  NUTRITION DIAGNOSIS:   Malnutrition (severe) related to social / environmental circumstances (anorexia) as evidenced by severe depletion of muscle mass, severe depletion of body fat, percent weight loss.  GOAL:   Patient will meet greater than or equal to 90% of their needs  MONITOR:   PO intake, Supplement acceptance, Weight trends, Labs  REASON FOR ASSESSMENT:   Malnutrition Screening Tool, Consult Assessment of nutrition requirement/status  ASSESSMENT:   26 y.o. male with a medical history of anorexia who presented to the ER from his primary care provider's office for weight loss, low blood pressure, failure to thrive. Patient has had weight loss issues in the past due to constraints of financial situations and being homeless. Patient was able to obtain food but not able to prepare her preserved food. He also states that he has been very limited with his diet due to his gastritis by avoiding spicy foods and certain fruits. Patient states that he has been following up with a nutritionist and they have come up with a diet which she tries to follow. He admits to eating vegetables, pizza, chicken and fish, granola etc. He no longer eats cookies and chips. Patient feels he has been unable to eat all meals due to his current job. Since having a job, patient states he has been doing better and feeling better. Patient currently has been IVCed by his family. This also occurred last your when patient had similar presentation.   Pt seen for MST. BMI indicates underweight  status. Chart review shows 85% intake of food at 0059 while in the ED; unsure of what this consisted of. H&P, as outlined above, indicates that pt works with a nutritionist on an outpatient basis. Pt states he believes he [name] and that he has been working with her for 4-6 weeks. He states that this was after "release from inpatient treatment." He feels that he and Dr. Tristan SchroederStokes work well together and he feels comfortable talking with her.   Per Dr. Armen PickupFunches' note yesterday at 10 AM: Anorexia: currently not seeing a therapist. Currently not taking medication. He denies dizziness, lightheadedness, fatigue, palpitations. He reports abdominal discomfort at time, citing gastritis. He denis nausea and emesis. He admits to constipation. He reports he does not eat fried foods, cookies, crackers, potato chips, microwave meals. He is eating nutrigrain bars, granola bars, oatmeal, wheat bread. He is drinking water. He also drinks apple juice. He avoid citrus foods, he eliminated spicy foods. He typically skips breakfast. He drank a little water when brushing his teeth.   24 hr food recall:  mixed vegetable, fish fillet sticks, and mini cheese pizza. He drink 3-4, 8.5 oz of water per day. He did not eat anything else that. This is a typical 24 hr intake. He does have ensure with his meal some days.   He eats a lot of beans He does not eat pork or chicken.  In the past week he denies not eating for 24 hrs or more.  He is not exercising currently but admits to exercising on treadmill 2-3  miles per day lifting weights within the last month.  He walks for transportation. He walks 5 miles per day.  He works in a [employer]. He started in 01/2016.   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   Physical assessment to upper body only shows severe muscle and fat wasting; lower body not assessed during this visit. Per chart review, pt has lost 12 lbs (10% body weight) in the past 3 months which is significant for time frame.  Medications reviewed; 40 mEq oral KCl x1 dose yesterday.  Labs reviewed; K: 3.4 mmol/L, BUN: 24 mg/dL, Ca: 8.5 mg/dL, AST/ALT elevated.   IVF: NS @ 125 mL/hr.    Diet Order:  Diet regular Room service appropriate? Yes; Fluid consistency: Thin  Skin:  Reviewed, no issues  Last BM:  5/7  Height:   Ht Readings from Last 1 Encounters:  06/10/16 5\' 10"  (1.778 m)    Weight:   Wt Readings from Last 1 Encounters:  06/11/16 104 lb 11.5 oz (47.5 kg)    Ideal Body Weight:  75.45 kg  BMI:  Body mass index is 15.03 kg/m.  Estimated Nutritional Needs:   Kcal:  1665-1900 (35-40 kcal/kg)  Protein:  70-80 grams (1.5-1.7 grams/kg)  Fluid:  >/= 1.8 L/day  EDUCATION NEEDS:   No education needs identified at this time    Stanley Gammon, MS, RD, LDN, CNSC Inpatient Clinical Dietitian Pager # [phone] After hours/weekend pager # [phone]

## 2016-06-11 NOTE — Clinical Social Work Psych Note (Signed)
Clinical Social Worker Psych Service Line Progress Note  Clinical Social Worker: Lia Hopping, LCSW Date/Time: 06/11/2016, 4:11 PM   Review of Patient  Overall Medical Condition:  Intensive Care Unit  Participation Level:  Minimal Participation Quality: Appropriate Other Participation Quality: Calm   Affect: Appropriate Cognitive:  Alert and Oriented.  Reaction to Medications/Concerns:  Patient presented no concerns about medications.   Modes of Intervention: Support, Exploration   Summary of Progress/Plan at Discharge  Summary of Progress/Plan at Discharge: CSW and psychiatrist met with patient at bedside, explain role and reason for visit.  Patient agreeable to talk. Patient states he was homeless until recently. During this time patient states he was unable to eat prepare meals because he did not have access to a stove or refrigerator to store foods. Patient express during this time he lost a significant amount of weight even though he attempted to eat at least one meal a day. The patient states he currently living with his grandmother and has been able to cook meals for himself. Patient states he uses cannabis daily but does not feel it is an issue. Patient denies having depression and denies SI/HI. Per chart, patient went to [facility] about a year for similar symptoms, not eating well and depression.   Plan: Assist with psychiatrist recommendations.   Kathrin Greathouse, Latanya Presser, MSW Clinical Social Worker 5E and Psychiatric Service Line [phone] 06/11/2016  4:19 PM

## 2016-06-11 NOTE — Progress Notes (Signed)
  Echocardiogram 2D Echocardiogram has been performed.  Stanley Moon 06/11/2016, 2:29 PM

## 2016-06-11 NOTE — Care Management Note (Signed)
Case Management Note  Patient Details  Name: Carlynn SpryQunzae Weikel MRN: [id number] Date of Birth: 02/04/1990  Subjective/Objective:       IVC'd to psych.  Eating disorder.             Action/Plan: Date:  Jun 11, 2016 Chart reviewed for concurrent status and case management needs. Will continue to follow patient progress. Discharge Planning: following for needs Expected discharge date: [id number] Marcelle SmilingRhonda Davis, BSN, [place], [place]   [phone]  Expected Discharge Date:   (unknown)               [facility]  [facility]  In-House Referral:  Clinical Social Work  Discharge planning Services  CM Consult  Post Acute Care Choice:    Choice offered to:     DME Arranged:    DME Agency:     HH Arranged:    HH Agency:     Status of Service:  In process, will continue to follow  If discussed at Long Length of Stay Meetings, dates discussed:    Additional Comments:  Golda AcreDavis, Rhonda Lynn, RN 06/11/2016, 8:01 AM

## 2016-06-11 NOTE — Consult Note (Signed)
[facility] Face-to-Face Psychiatry Consult   Reason for Consult:  THC abuse and anorexia Referring Physician:  Dr. Marland Mcalpine Patient Identification: Stanley Moon MRN:  [id number] Principal Diagnosis: Marijuana use Diagnosis:   Patient Active Problem List   Diagnosis Date Noted  . Furuncle of chest wall [L02.223] 06/10/2016  . Bradycardia [R00.1] 06/10/2016  . Elevated LFTs [R79.89] 06/10/2016  . Gastritis [K29.70] 06/10/2016  . Underweight [R63.6] 12/21/2015  . Anorexia nervosa [F50.00] 11/05/2015    Total Time spent with patient: 1 hour  Subjective:   Stanley Moon is a 26 y.o. male patient admitted with Anorexia, and cannabis abuse.  HPI:  Stanley Moon is an 26 y.o. male, seen and chart reviewed for this Face-to-face psychiatric consultation and evaluation for increased anorexia, bradycardia, anxiety and depression. Patient reported he started developing anorexia and loss of weight when he was homeless and not able to eat healthy diet about 3 years ago. Patient reported he has been using cannabis over the years and reportedly uses 1 g 3-4 times a week. Patient reported he has no symptoms of depression or anxiety, auditory/visual hallucinations, delusions and paranoia. Patient denies current suicidal/homicidal ideation, intention or plans. Patient contract for safety.   Below information from behavioral health assessment has been reviewed by me and I agreed with the findings. Patient with history of Anorexia, anxiety, and depression. He presents to [facility], voluntarily. Referred to [facility] by his PCP for medical clearance. Patient was later served IVC papers taken out by his mother Stanley Moon [phone] The IVC reads: "Respondent is a 26 year old males. 5'10" and weighs 102 LBS. He is diagnosed with anorexia nervosa and depression and also uses marijuana. He may eat one meal per day but when he cooks, it's in the dark. He also bathes in the dark. He doesn't sleep well. 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 Petitioner sts respondents moods are "Flat". Nothing seems to provoke any emotion from him. He doesn't think anything is wrong with him or his extreme weight loss. When he was committed last year, Dr. Mckinley Jewel stte that if he was "Not able to maintain adequate nutritional intake it is my opinion as his psychiatrist that he should be petitioned for IVC.Stanley KitchenMarland KitchenMarland KitchenAs soon as possible". He is a danger to self."  Patient denies suicidal ideations. He denies history of suicide attempts and/or gestures. He denies self mutilating behaviors. He does admit to depression. He tends to isolate himself from others and sometimes feels hopeless. He denies current stressors. However explains that he was recently homeless and that was a major stressor for him. He is now living with grandmother and feels hopeful about his future. He admits that he has battled with anorexia. Sts that he is trying to work on his appetite but understands this will take some time. Patient explains that while he was homeless he was unable to eat properly. Sts, "It wasn't a choice..I just didn't have a refrigerator, stove, microwave...Stanley Kitchenno way to preserve food". Patient does eat but admits that he doesn't eat a lot. He typically eats one meal a day and is trying to increase his meal intake. Patient admits that he showers in the dark and also cooks in the dark.  Sts, "I'm not hurting anyone it's just what I do sometimes". He denies HI. He has no history of aggression or assaultive behaviors. He denies alcohol use. He does admit to regular THC use. Patient has received services at [facility] in the past.  Last visit to [facility] was 02/2016. He was admitted to [facility] Oct 1-12, 2017 for depression and anorexia.    Past Psychiatric History: Depression and anorexia and recent admission to [facility] on 11/05/2015.  Risk to Self: Suicidal Ideation: No Suicidal Intent: No Is patient at risk for suicide?: No Suicidal Plan?:  No Access to Means: No What has been your use of drugs/alcohol within the last 12 months?:  (thc ) How many times?:  (0) Other Self Harm Risks:  (no self harm risk; no hx of self injurious behaviors ) Triggers for Past Attempts:  (no past attempts or gestures ) Intentional Self Injurious Behavior: None Risk to Others: Homicidal Ideation: No Thoughts of Harm to Others: No Current Homicidal Intent: No Current Homicidal Plan: No Access to Homicidal Means: No Identified Victim:  (n/a) History of harm to others?: No Assessment of Violence: None Noted Violent Behavior Description:  (patient is calm and cooperative ) Does patient have access to weapons?: No Criminal Charges Pending?: No Does patient have a court date: No Prior Inpatient Therapy: Prior Inpatient Therapy: Yes Prior Therapy Dates:  [facility] October 1-12, 2017) Prior Therapy Facilty/Provider(s):  [facility]) Reason for Treatment:  (depression and anorexia ) Prior Outpatient Therapy: Prior Outpatient Therapy: Yes Prior Therapy Dates:  (last visit to [facility] 02/2016) Prior Therapy Facilty/Provider(s):  [facility] ) Reason for Treatment:  (medication managment ) Does patient have an ACCT team?: No Does patient have Intensive In-House Services?  : No Does patient have [facility] services? : No Does patient have P4CC services?: No  Past Medical History:  Past Medical History:  Diagnosis Date  . Anorexia   . Anxiety   . Asthma    as a child, "I haven't used inhalers in years"  . Depression   . Eczema   . Syphilis 08/29/2013    Past Surgical History:  Procedure Laterality Date  . EXTERNAL EAR SURGERY     Family History: History reviewed. No pertinent family history. Family Psychiatric  History: Noncontributory Social History:  History  Alcohol Use No     History  Drug Use No    Social History   Social History  . Marital status: Single    Spouse name: N/A  . Number of children: N/A  . Years of education: N/A   Social  History Main Topics  . Smoking status: Never Smoker  . Smokeless tobacco: Never Used  . Alcohol use No  . Drug use: No  . Sexual activity: Not Currently    Birth control/ protection: Condom     Comment: women    Other Topics Concern  . None   Social History Narrative  . None   Additional Social History:    Allergies:   Allergies  Allergen Reactions  . Peanuts [Peanut Oil] Anaphylaxis  . Lactose Intolerance (Gi) Nausea And Vomiting    Labs:  Results for orders placed or performed during the hospital encounter of 06/10/16 (from the past 48 hour(s))  Comprehensive metabolic panel     Status: Abnormal   Collection Time: 06/10/16 12:56 PM  Result Value Ref Range   Sodium 138 135 - 145 mmol/L   Potassium 3.6 3.5 - 5.1 mmol/L   Chloride 105 101 - 111 mmol/L   CO2 26 22 - 32 mmol/L   Glucose, Bld 83 65 - 99 mg/dL   BUN 27 (H) 6 - 20 mg/dL   Creatinine, Ser 0.94 0.61 - 1.24 mg/dL   Calcium 9.0 8.9 - 10.3 mg/dL   Total  Protein 6.6 6.5 - 8.1 g/dL   Albumin 3.9 3.5 - 5.0 g/dL   AST 893 (H) 15 - 41 U/L   ALT 144 (H) 17 - 63 U/L   Alkaline Phosphatase 39 38 - 126 U/L   Total Bilirubin 0.5 0.3 - 1.2 mg/dL   GFR calc non Af Amer >60 >60 mL/min   GFR calc Af Amer >60 >60 mL/min    Comment: (NOTE) The eGFR has been calculated using the CKD EPI equation. This calculation has not been validated in all clinical situations. eGFR's persistently <60 mL/min signify possible Chronic Kidney Disease.    Anion gap 7 5 - 15  Ethanol     Status: None   Collection Time: 06/10/16 12:56 PM  Result Value Ref Range   Alcohol, Ethyl (B) <5 <5 mg/dL    Comment:        LOWEST DETECTABLE LIMIT FOR SERUM ALCOHOL IS 5 mg/dL FOR MEDICAL PURPOSES ONLY   Salicylate level     Status: None   Collection Time: 06/10/16 12:56 PM  Result Value Ref Range   Salicylate Lvl <7.0 2.8 - 30.0 mg/dL  Acetaminophen level     Status: Abnormal   Collection Time: 06/10/16 12:56 PM  Result Value Ref Range    Acetaminophen (Tylenol), Serum <10 (L) 10 - 30 ug/mL    Comment:        THERAPEUTIC CONCENTRATIONS VARY SIGNIFICANTLY. A RANGE OF 10-30 ug/mL MAY BE AN EFFECTIVE CONCENTRATION FOR MANY PATIENTS. HOWEVER, SOME ARE BEST TREATED AT CONCENTRATIONS OUTSIDE THIS RANGE. ACETAMINOPHEN CONCENTRATIONS >150 ug/mL AT 4 HOURS AFTER INGESTION AND >50 ug/mL AT 12 HOURS AFTER INGESTION ARE OFTEN ASSOCIATED WITH TOXIC REACTIONS.   cbc     Status: Abnormal   Collection Time: 06/10/16 12:56 PM  Result Value Ref Range   WBC 2.4 (L) 4.0 - 10.5 K/uL   RBC 4.37 4.22 - 5.81 MIL/uL   Hemoglobin 12.8 (L) 13.0 - 17.0 g/dL   HCT 73.7 (L) 49.6 - 64.6 %   MCV 87.0 78.0 - 100.0 fL   MCH 29.3 26.0 - 34.0 pg   MCHC 33.7 30.0 - 36.0 g/dL   RDW 60.5 63.7 - 29.4 %   Platelets 153 150 - 400 K/uL  Rapid urine drug screen (hospital performed)     Status: Abnormal   Collection Time: 06/10/16 12:56 PM  Result Value Ref Range   Opiates NONE DETECTED NONE DETECTED   Cocaine NONE DETECTED NONE DETECTED   Benzodiazepines NONE DETECTED NONE DETECTED   Amphetamines NONE DETECTED NONE DETECTED   Tetrahydrocannabinol POSITIVE (A) NONE DETECTED   Barbiturates NONE DETECTED NONE DETECTED    Comment:        DRUG SCREEN FOR MEDICAL PURPOSES ONLY.  IF CONFIRMATION IS NEEDED FOR ANY PURPOSE, NOTIFY LAB WITHIN 5 DAYS.        LOWEST DETECTABLE LIMITS FOR URINE DRUG SCREEN Drug Class       Cutoff (ng/mL) Amphetamine      1000 Barbiturate      200 Benzodiazepine   200 Tricyclics       300 Opiates          300 Cocaine          300 THC              50   Magnesium     Status: None   Collection Time: 06/10/16 12:56 PM  Result Value Ref Range   Magnesium 2.1 1.7 - 2.4 mg/dL  Brain  natriuretic peptide     Status: None   Collection Time: 06/10/16 12:56 PM  Result Value Ref Range   B Natriuretic Peptide 37.2 0.0 - 100.0 pg/mL  Prealbumin     Status: None   Collection Time: 06/10/16  5:44 PM  Result Value Ref Range    Prealbumin 21.7 18 - 38 mg/dL    Comment: Performed at [facility], [place] [address] [place], [place] [address]  I-Stat Troponin, ED (not at [facility] [place])     Status: None   Collection Time: 06/10/16  5:52 PM  Result Value Ref Range   Troponin i, poc 0.00 0.00 - 0.08 ng/mL   Comment 3            Comment: Due to the release kinetics of cTnI, a negative result within the first hours of the onset of symptoms does not rule out myocardial infarction with certainty. If myocardial infarction is still suspected, repeat the test at appropriate intervals.   MRSA PCR Screening     Status: None   Collection Time: 06/10/16  6:30 PM  Result Value Ref Range   MRSA by PCR NEGATIVE NEGATIVE    Comment:        The GeneXpert MRSA Assay (FDA approved for NASAL specimens only), is one component of a comprehensive MRSA colonization surveillance program. It is not intended to diagnose MRSA infection nor to guide or monitor treatment for MRSA infections.   TSH     Status: None   Collection Time: 06/10/16 10:49 PM  Result Value Ref Range   TSH 1.023 0.350 - 4.500 uIU/mL    Comment: Performed by a 3rd Generation assay with a functional sensitivity of <=0.01 uIU/mL.  T4, free     Status: None   Collection Time: 06/10/16 10:49 PM  Result Value Ref Range   Free T4 0.76 0.61 - 1.12 ng/dL    Comment: (NOTE) Biotin ingestion may interfere with free T4 tests. If the results are inconsistent with the TSH level, previous test results, or the clinical presentation, then consider biotin interference. If needed, order repeat testing after stopping biotin. Performed at [facility], [place] [address] [place], [place] [address]   CBC     Status: Abnormal   Collection Time: 06/11/16  3:06 AM  Result Value Ref Range   WBC 2.8 (L) 4.0 - 10.5 K/uL   RBC 3.87 (L) 4.22 - 5.81 MIL/uL   Hemoglobin 11.1 (L) 13.0 - 17.0 g/dL   HCT 33.8 (L) 39.0 - 52.0 %   MCV 87.3 78.0 - 100.0 fL   MCH 28.7 26.0 - 34.0 pg    MCHC 32.8 30.0 - 36.0 g/dL   RDW 13.4 11.5 - 15.5 %   Platelets 137 (L) 150 - 400 K/uL  Comprehensive metabolic panel     Status: Abnormal   Collection Time: 06/11/16  3:06 AM  Result Value Ref Range   Sodium 138 135 - 145 mmol/L   Potassium 3.4 (L) 3.5 - 5.1 mmol/L   Chloride 106 101 - 111 mmol/L   CO2 27 22 - 32 mmol/L   Glucose, Bld 124 (H) 65 - 99 mg/dL   BUN 24 (H) 6 - 20 mg/dL   Creatinine, Ser 1.07 0.61 - 1.24 mg/dL   Calcium 8.5 (L) 8.9 - 10.3 mg/dL   Total Protein 5.5 (L) 6.5 - 8.1 g/dL   Albumin 3.4 (L) 3.5 - 5.0 g/dL   AST 91 (H) 15 - 41 U/L  ALT 117 (H) 17 - 63 U/L   Alkaline Phosphatase 34 (L) 38 - 126 U/L   Total Bilirubin 0.6 0.3 - 1.2 mg/dL   GFR calc non Af Amer >60 >60 mL/min   GFR calc Af Amer >60 >60 mL/min    Comment: (NOTE) The eGFR has been calculated using the CKD EPI equation. This calculation has not been validated in all clinical situations. eGFR's persistently <60 mL/min signify possible Chronic Kidney Disease.    Anion gap 5 5 - 15    Current Facility-Administered Medications  Medication Dose Route Frequency Provider Last Rate Last Dose  . 0.9 %  sodium chloride infusion   Intravenous Continuous Cristal Ford, DO 125 mL/hr at 06/11/16 1000    . acetaminophen (TYLENOL) tablet 650 mg  650 mg Oral Q6H PRN Cristal Ford, DO       Or  . acetaminophen (TYLENOL) suppository 650 mg  650 mg Rectal Q6H PRN Cristal Ford, DO      . enoxaparin (LOVENOX) injection 40 mg  40 mg Subcutaneous Q24H Cristal Ford, DO   40 mg at 06/10/16 2007  . feeding supplement (ENSURE ENLIVE) (ENSURE ENLIVE) liquid 237 mL  237 mL Oral BID BM Sheikh, Omair Latif, DO      . multivitamin with minerals tablet 1 tablet  1 tablet Oral Daily Sheikh, Omair Latif, DO      . potassium chloride SA (K-DUR,KLOR-CON) CR tablet 40 mEq  40 mEq Oral Once Cristal Ford, DO        Musculoskeletal: Strength & Muscle Tone: decreased Gait & Station: unable to stand Patient leans:  N/A  Psychiatric Specialty Exam: Physical Exam as per history and physical   ROS complaining of anorexia, significant weight loss, anxiety, depression but denied nausea, vomiting, abdomen pain and shortness of breath. Patient endorses cannabis abuse which is a heavy use.  No Fever-chills, No Headache, No changes with Vision or hearing, reports vertigo No problems swallowing food or Liquids, No Chest pain, Cough or Shortness of Breath, No Abdominal pain, No Nausea or Vommitting, Bowel movements are regular, No Blood in stool or Urine, No dysuria, No new skin rashes or bruises, No new joints pains-aches,  No new weakness, tingling, numbness in any extremity, No recent weight gain or loss, No polyuria, polydypsia or polyphagia,  A full 10 point Review of Systems was done, except as stated above, all other Review of Systems were negative.   Blood pressure 108/68, pulse (!) 43, temperature 98.5 F (36.9 C), temperature source Oral, resp. rate 16, height '5\' 10"'$  (1.778 m), weight 47.5 kg (104 lb 11.5 oz), SpO2 100 %.Body mass index is 15.03 kg/m.  General Appearance: Bizarre and Guarded  Eye Contact:  Fair  Speech:  Pressured  Volume:  Normal  Mood:  Anxious and Depressed  Affect:  Inappropriate  Thought Process:  Irrelevant  Orientation:  Full (Time, Place, and Person)  Thought Content:  Rumination  Suicidal Thoughts:  No  Homicidal Thoughts:  No  Memory:  Immediate;   Good Recent;   Fair Remote;   Fair  Judgement:  Impaired  Insight:  Fair  Psychomotor Activity:  Decreased  Concentration:  Concentration: Fair and Attention Span: Fair  Recall:  Good  Fund of Knowledge:  Good  Language:  Good  Akathisia:  Negative  Handed:  Right  AIMS (if indicated):     Assets:  Communication Skills Desire for Improvement Financial Resources/Insurance Housing Leisure Time Resilience Social Support Talents/Skills Transportation  ADL's:  Impaired  Cognition:  WNL  Sleep:         Treatment Plan Summary: Patient presented with chronic and recurrent heavy use of cannabis, anorexia, depression and significant weight loss but denies current symptoms of acute depression, anxiety and psychosis. Patient has no evidence of auditory or visual hallucinations, delusions and paranoia has no suicidal/homicidal ideation, intentions or plans. Patient contract for safety while in the hospital.  Cannabis use disorder Anorexia probably secondary to cannabis  Recommendation: Continue IVC for medical emergency only Patient benefit from outpatient counseling services for cannabis use disorder, depression and anorexia and medically stable. No psychotropic medication and be recommended at this time  Disposition: Patient will be referred to the outpatient medication management for anorexia and depression medication management and counseling services and medically stable. No evidence of imminent risk to self or others at present.   Supportive therapy provided about ongoing stressors.  Ambrose Finland, MD 06/11/2016 11:50 AM

## 2016-06-11 NOTE — Progress Notes (Signed)
PROGRESS NOTE    Stanley Moon  ZOX:[id number] DOB: 30-May-1990 DOA: 06/10/2016 PCP: Dessa Phi, MD   Brief Narrative:  Stanley Moon is a 26 y.o. male with a medical history of anorexia who presented to the ER from his primary care provider's office for weight loss, low blood pressure, failure to thrive. Patient has had weight loss issues in the past due to constraints of financial situations and being homeless. Patient was able to obtain food but not able to prepare or preserve food. He also states that he has been very limited with his diet due to his gastritis by avoiding spicy foods and certain fruits. Patient states that he has been following up with a nutritionist and they have come up with a diet which she tries to follow. He admits to eating vegetables, pizza, chicken and fish, granola etc. He no longer eats cookies and chips. Patient feels he has been unable to eat all meals due to his current job. Since having a job, patient states he has been doing better and feeling better. Patient currently has been IVCed by his family because he isolates himself and only chooses to eat 1 meal a day even though having access food living with his grandmother. This also occurred last your when patient had similar presentation. Psychiatry consulted for further recommendations and evaluation curreltnt.  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  -Currently still asymptomatic with HR's ranging in the 40's to 50's -Suspect secondary to oor nutritional status -Will obtain Echocardiogram (pending),  -TSH was 1.023, FT4 was 0.76 -Potassium 3.4 this AM, will replace with supplementation -Magnesium 2.1 -Remain in Stepdown for close  monitoring with Bedside Telemetry  Mild Hypotension -patient has had normal BP in the past -BP was 108/68 this AM -C/w IVF with NS at 125 mL/hr  Anorexia/Failure to Thrive -Patient was seeing a nutritionist, has had a long standing issue with diet and gastritis  -BMU was 14.7 on Admission -Psych consulted and appreciate further Recommendations -TSH was 1.023 -Nutrition consult appreciated -Prealbumin 21.7, Albumin currently 3.4 -C/w IVF with NS at 125 mL/hr -Currently IVCed  -Patient feels he eats enough, eats a vegetable at every meal, fish, chicken, etc.   Severe Protein Malnutrition -BMI was 14.7 -C/w Ensure Enlive Feeding Supplements 237 mL po BID -C/w MVI + Minerals Daily  Pancytopenia -WBC now 2.8, Hb/Hct now 11.1/33.8 and Platelet Count 137 -Check HIV; (Last RPR and HIV Screen Non-Reactive but prior RPR was Positive and has Hx of Syphilis) -Albumin now 3.4 -Repeat CMP in AM  Hypokalemia -Patient's K+ went from 3.6 -> 3.4; Mag yesterday was 2.1 -Replete with po KCl 40 mEQ BID -Repeat CMP in AM  Marijuana Abuse -Tox screen + for THC -Discussed cessation  History of Gastritis -Tries to control with diet -May consider adding PPI  Lower Extremity Edema -Likely secondary to poor nutritional status; Albumin 3.4 this AM -BNP 37.2 -Will be Checking ECHOCardiogram -Plan as above  Elevated LFTs -Will place patient on IVF and continue to monitor  -Possibly due to Dehydration -Trending Down as AST went from 143 on Admission -> 91 and ALT went from 154 on Admission -> 117 -Check Hepatitis Panel -Check Abdominal Ultrasound  in AM  Hx of Depression -Psych Consultedand appreciate Dr. Valora Corporal evaluation  DVT prophylaxis: Lovenox 40 mg sq q24h Code Status: FULL CODE Family Communication: No Family Present at bedside Disposition Plan: Remain in SDU currently and pending Psych Evaluation. [facility] to [facility]  Consultants:  -Psychiatry   Procedures:    -None   Antimicrobials:  Anti-infectives    None     Subjective: Seen and examined and had no complaints. States he was sent over by his PCP. No nausea or vomiting but complained of Right Flank Pain that radiated to his Lower Right Abdomen. No other concerns or complaints and states he has been losing weight for over 8 weeks.   Objective: Vitals:   06/11/16 0828 06/11/16 1000 06/11/16 1200 06/11/16 1218  BP:  108/68 106/61   Pulse:      Resp:  16 12   Temp: 98.5 F (36.9 C)   98.4 F (36.9 C)  TempSrc: Oral   Oral  SpO2:  100% 100%   Weight:      Height:        Intake/Output Summary (Last 24 hours) at 06/11/16 1247 Last data filed at 06/11/16 1200  Gross per 24 hour  Intake          2129.17 ml  Output                0 ml  Net          2129.17 ml   Filed Weights   06/10/16 1248 06/11/16 0429  Weight: 46.4 kg (102 lb 6.4 oz) 47.5 kg (104 lb 11.5 oz)   Examination: Physical Exam:  Constitutional: Thin and Cachectic AAM who is NAD and appears calm and comfortable Eyes: Lids and conjunctivae normal, sclerae anicteric  ENMT: External Ears, Nose appear normal. Grossly normal hearing.  Neck: Appears normal, supple, no cervical masses, normal ROM, no appreciable thyromegaly, no JVD Respiratory: Clear to auscultation bilaterally, no wheezing, rales, rhonchi or crackles. Normal respiratory effort and patient is not tachypenic. No accessory muscle use.  Cardiovascular: Bradycardic Rate but regular rhythm., no murmurs / rubs / gallops. S1 and S2 auscultated. No extremity edema. Some bilateral LE Edema noted Abdomen: Soft, non-tender to palpate, non-distended. No masses palpated. No appreciable hepatosplenomegaly. Bowel sounds positive.  GU: Deferred. Musculoskeletal: No clubbing / cyanosis of digits/nails. No joint deformity upper and lower extremities. Good ROM, no contractures.  Skin: No rashes, lesions, ulcers. No induration; Warm and dry.  Neurologic: CN 2-12 grossly intact  with no focal deficits. Sensation intact in all 4 Extremities. Romberg sign cerebellar reflexes not assessed.  Psychiatric: Normal judgment and insight. Alert and oriented x 3. Normal appearing mood and appropriate affect.   Data Reviewed: I have personally reviewed following labs and imaging studies  CBC:  Recent Labs Lab 06/10/16 1144 06/10/16 1256 06/11/16 0306  WBC 3.1* 2.4* 2.8*  HGB  --  12.8* 11.1*  HCT 40.4 38.0* 33.8*  MCV 90 87.0 87.3  PLT 169 153 137*   Basic Metabolic Panel:  Recent Labs Lab 06/10/16 1144 06/10/16 1256 06/11/16 0306  NA 142 138 138  K 3.8 3.6 3.4*  CL 101 105 106  CO2 27 26 27   GLUCOSE 80 83 124*  BUN 25* 27* 24*  CREATININE 1.13 0.94 1.07  CALCIUM 9.1 9.0 8.5*  MG  --  2.1  --    GFR: Estimated Creatinine Clearance: 71.5 mL/min (by C-G formula based on SCr of 1.07 mg/dL). Liver Function Tests:  Recent  Labs Lab 06/10/16 1144 06/10/16 1256 06/11/16 0306  AST 143* 128* 91*  ALT 154* 144* 117*  ALKPHOS 46 39 34*  BILITOT 0.3 0.5 0.6  PROT 6.5 6.6 5.5*  ALBUMIN 4.1 3.9 3.4*   No results for input(s): LIPASE, AMYLASE in the last 168 hours. No results for input(s): AMMONIA in the last 168 hours. Coagulation Profile: No results for input(s): INR, PROTIME in the last 168 hours. Cardiac Enzymes: No results for input(s): CKTOTAL, CKMB, CKMBINDEX, TROPONINI in the last 168 hours. BNP (last 3 results) No results for input(s): PROBNP in the last 8760 hours. HbA1C: No results for input(s): HGBA1C in the last 72 hours. CBG: No results for input(s): GLUCAP in the last 168 hours. Lipid Profile: No results for input(s): CHOL, HDL, LDLCALC, TRIG, CHOLHDL, LDLDIRECT in the last 72 hours. Thyroid Function Tests:  Recent Labs  06/10/16 2249  TSH 1.023  FREET4 0.76   Anemia Panel: No results for input(s): VITAMINB12, FOLATE, FERRITIN, TIBC, IRON, RETICCTPCT in the last 72 hours. Sepsis Labs: No results for input(s): PROCALCITON,  LATICACIDVEN in the last 168 hours.  Recent Results (from the past 240 hour(s))  MRSA PCR Screening     Status: None   Collection Time: 06/10/16  6:30 PM  Result Value Ref Range Status   MRSA by PCR NEGATIVE NEGATIVE Final    Comment:        The GeneXpert MRSA Assay (FDA approved for NASAL specimens only), is one component of a comprehensive MRSA colonization surveillance program. It is not intended to diagnose MRSA infection nor to guide or monitor treatment for MRSA infections.     Radiology Studies: Dg Chest 2 View  Result Date: 06/10/2016 CLINICAL DATA:  Abnormal electrocardiogram EXAM: CHEST  2 VIEW COMPARISON:  April 06, 2004 FINDINGS: Lungs are clear. Heart size and pulmonary vascularity are normal. No adenopathy. No bone lesions. IMPRESSION: No edema or consolidation. Electronically Signed   By: Bretta BangWilliam  Woodruff III M.D.   On: 06/10/2016 18:07   Scheduled Meds: . enoxaparin (LOVENOX) injection  40 mg Subcutaneous Q24H  . feeding supplement (ENSURE ENLIVE)  237 mL Oral BID BM  . multivitamin with minerals  1 tablet Oral Daily  . potassium chloride  40 mEq Oral BID   Continuous Infusions: . sodium chloride 125 mL/hr at 06/11/16 1200    LOS: 0 days   Merlene Laughtermair Latif Sheikh, DO [facility] Pager [phone]  If 7PM-7AM, please contact night-coverage [email or web address] Password [email or web address] 06/11/2016, 12:47 PM

## 2016-06-12 DIAGNOSIS — F5001 Anorexia nervosa, restricting type: Principal | ICD-10-CM

## 2016-06-12 LAB — CBC WITH DIFFERENTIAL/PLATELET
BASOS ABS: 0 10*3/uL (ref 0.0–0.1)
Basophils Relative: 1 %
EOS ABS: 0.1 10*3/uL (ref 0.0–0.7)
Eosinophils Relative: 3 %
HCT: 37.9 % — ABNORMAL LOW (ref 39.0–52.0)
HEMOGLOBIN: 12.8 g/dL — AB (ref 13.0–17.0)
LYMPHS ABS: 2.2 10*3/uL (ref 0.7–4.0)
Lymphocytes Relative: 70 %
MCH: 29.5 pg (ref 26.0–34.0)
MCHC: 33.8 g/dL (ref 30.0–36.0)
MCV: 87.3 fL (ref 78.0–100.0)
MONO ABS: 0.2 10*3/uL (ref 0.1–1.0)
Monocytes Relative: 5 %
NEUTROS ABS: 0.7 10*3/uL — AB (ref 1.7–7.7)
Neutrophils Relative %: 21 %
Platelets: 128 10*3/uL — ABNORMAL LOW (ref 150–400)
RBC: 4.34 MIL/uL (ref 4.22–5.81)
RDW: 13.2 % (ref 11.5–15.5)
WBC: 3.2 10*3/uL — ABNORMAL LOW (ref 4.0–10.5)

## 2016-06-12 LAB — COMPREHENSIVE METABOLIC PANEL
ALBUMIN: 3.3 g/dL — AB (ref 3.5–5.0)
ALT: 114 U/L — AB (ref 17–63)
AST: 84 U/L — AB (ref 15–41)
Alkaline Phosphatase: 34 U/L — ABNORMAL LOW (ref 38–126)
Anion gap: 5 (ref 5–15)
BUN: 19 mg/dL (ref 6–20)
CHLORIDE: 105 mmol/L (ref 101–111)
CO2: 27 mmol/L (ref 22–32)
Calcium: 8.4 mg/dL — ABNORMAL LOW (ref 8.9–10.3)
Creatinine, Ser: 0.9 mg/dL (ref 0.61–1.24)
GFR calc Af Amer: >60 mL/min (ref >60)
GFR calc non Af Amer: >60 mL/min (ref >60)
GLUCOSE: 79 mg/dL (ref 65–99)
POTASSIUM: 4.3 mmol/L (ref 3.5–5.1)
Sodium: 137 mmol/L (ref 135–145)
Total Bilirubin: 0.5 mg/dL (ref 0.3–1.2)
Total Protein: 5.5 g/dL — ABNORMAL LOW (ref 6.5–8.1)

## 2016-06-12 LAB — MAGNESIUM: Magnesium: 2 mg/dL (ref 1.7–2.4)

## 2016-06-12 LAB — HEPATITIS PANEL, ACUTE
HCV Ab: <0.1 s/co ratio (ref 0.0–0.9)
HEP A IGM: NEGATIVE
HEP B C IGM: NEGATIVE
Hepatitis B Surface Ag: NEGATIVE

## 2016-06-12 LAB — HIV ANTIBODY (ROUTINE TESTING W REFLEX): HIV Screen 4th Generation wRfx: NONREACTIVE

## 2016-06-12 LAB — PHOSPHORUS: Phosphorus: 2.6 mg/dL (ref 2.5–4.6)

## 2016-06-12 LAB — RPR: RPR Ser Ql: NONREACTIVE

## 2016-06-12 MED ORDER — ADULT MULTIVITAMIN W/MINERALS CH: 1.0000 | ORAL_TABLET | Freq: Every day | ORAL | Status: AC

## 2016-06-12 MED ORDER — ENSURE ENLIVE PO LIQD: 237.0000 mL | Freq: Two times a day (BID) | ORAL | 12 refills | Status: DC

## 2016-06-12 NOTE — Progress Notes (Signed)
Date: Jun 11, 2016 Discharge orders review for case management needs.  None found Rhonda Davis, BSN, RN3, CCM: [phone] 

## 2016-06-12 NOTE — Progress Notes (Addendum)
CSW met with patient at bedside and provided patient with information for [facility] for outpatient psychiatric counseling. Patient reports he has been to [facility] in the past and is agreeable to follow up again. Patient states he will continue to see Dr. Adrian Blackwater for nutrition management.  CSW spoke with patient mother, she requested CSW look up information for residential treatment facilities for individuals with eating disorders. CSW discussed with RNCM. [facility] is private pay facility with no insurance. [facility]- Cecille Rubin, explain it is also private pay.  CSW discussed with patient mother,she reports she has looked into several places herself. . She reports since the patient is working part time, they plan to apply for medicaid.   Physician completed IVC rescind form, sent to [place].   Kathrin Greathouse, Latanya Presser, MSW Clinical Social Worker 5E and Psychiatric Service Line [phone] 06/12/2016  2:23 PM

## 2016-06-12 NOTE — Discharge Summary (Signed)
Physician Discharge Summary  Stanley Moon ZOX:[id number] DOB: 1990-12-09 DOA: 06/10/2016  PCP: Stanley Phi, MD  Admit date: 06/10/2016 Discharge date: 06/12/2016  Admitted From: Home Disposition:  Home  Recommendations for Outpatient Follow-up:  1. Follow up with PCP Dr. Armen Moon in 1-2 weeks for Nutrition Management  2. Follow up with Cardiology Dr. Allyson Moon as an outpatient on 06/20/16 at 10:20 AM 3. Follow with Resources provided by Health and safety inspector for Outpatient Psychiatric Counseling 4. Discuss about [employer] 5. Please obtain CMP/CBC in one week to check patient's K+ and Platelet Count  Home Health: No Equipment/Devices: None  Discharge Condition: Stable CODE STATUS: FULL CODE Diet recommendation: Regular   Brief/Interim Summary: Stanley Wrightis a 26 y.o.malewith a medical history of anorexia who presented to the ER from his primary care provider's office for weight loss, low blood pressure, failure to thrive. Patient has had weight loss issues in the past due to constraints of financial situations and being homeless. Patient was able to obtain food but not able to prepare or preserve food. He also states that he has been very limited with his diet due to his gastritis by avoiding spicy foods and certain fruits. Patient states that he has been following up with a nutritionist and they have come up with a diet which she tries to follow. He admits to eating vegetables, pizza, chicken and fish, granola etc. He no longer eats cookies and chips. Patient feels he has been unable to eat all meals due to his current job. Since having a job, patient states he has been doing better and feeling better. Patient currently has been IVCed by his family because he isolates himself and only chooses to eat 1 meal a day even though having access food living with his grandmother. This also occurred last your when patient had similar presentation. He was admitted for his Anorexia  and Psychiatry consulted for further recommendations and evaluation revealed no indication for inpatient Psychiatric Hospitalization as the Psychiatrist believes his Anorexia is related to his substantial Cannabis use. Psychiatry recommended outpatient counseling services. Patient remained bradycardic but stable and asymptomatic and ECHO done showed no wall motion abnormalities. An appointment was set for the patient to follow up with Cardiology on 06/20/16 and follow up with PCP as an outpatient. He will need close following and will need to follow up with [facility] services and outpatient Psychiatry.   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  -Currently still asymptomatic with HR's ranging in the 30's-40's -Suspect secondary to oor nutritional status -Echocardiogram done and showed normal wall motion and thickness with an EF of 60-65%,  -TSH was 1.023, FT4 was 0.76 -Potassium 4.3 this AM, Replete as necessary -Magnesium 2.0 -Follow up with Cardiology as an outpatient on 06/20/16 at 10:20 AM  Mild Hypotension -patient has had normal BP in the past -BP was 112/70 this AM -Was given IVF with NS at 125 mL/hr  Anorexia/Failure to Thrive -Patient was seeing a nutritionist, has had a long standing issue with diet and gastritis  -BMI was 14.7 on Admission -Psych consulted and appreciate further Recommendations -TSH was 1.023 -Nutrition consult appreciated; Patient feels he eats enough, eats a vegetable  at every meal, fish, chicken, etc. -Prealbumin 21.7, Albumin currently 3.3 -Was on IVF with NS at 125 mL/hr -Currently [facility] and now Rescinded.  -Psych Evaluated and feels like anorexia is 2/2 to his  Cannabis use disorder and currently not exhibiting signs/symptoms of acute depression, anxiety, and psychosis -Psych recommended benefit from OUTPATIENT counseling services for cannabis use disorder, depression, and anorexia when medically stable and recommends no psychotropic medication to be recommended at this time.  -Child psychotherapist provided patient with information for [facility] for outpatient psychiatric counseling   Severe Protein Malnutrition -BMI was 14.7 -C/w Ensure Enlive Feeding Supplements 237 mL po BID -C/w MVI + Minerals Daily  Pancytopenia -WBC went from 2.8 -> 3.2 , Hb/Hct went from 11.1/33.8 -> 12.8/37.9 and Platelet Count went from 137 -> 128 -Checked HIV and was Non-Reactive and RPR this time was Non-Reactive; (Last RPR and HIV Screen Non-Reactive but prior RPR was Positive and has Hx of Syphilis) -Albumin now 3.3 -Repeat CMP as an outpatient  Hypokalemia -Patient's K+ went from 3.6 -> 3.4 -> 4.3; Mag yesterday was 2.1 -Replete as necessary -Repeat CMP as an outpatient   Marijuana Abuse -Tox screen + for [facility] -Discussed cessation counseling given however patient does not want to stop and does not think he has a problem -Will need Outpatient Counseling services provided by Clinical Social Worker  History of Gastritis -Tries to control with diet -May consider adding PPI -Follow up with PCP as an outpatient.   Lower Extremity Edema, improved -Likely secondary to poor nutritional status; Albumin 3.3 this AM -BNP 37.2 -Plan as above -ECHOCardiogram unremarkable.   Elevated LFTs -Will place patient on IVF and continue to monitor  -Possibly due to Dehydration -Trending Down as AST went from 143 on Admission -> 84 and ALT went from 154 on Admission -> 114 -Checked Hepatitis Panel and was Negative -Checked Abdominal U/S and showed no explanation for elevated LFT's and small volume perihepatic ascites which was nonspecific.  -Repeat CMP as an outpatient.   Hx of  Depression -Psych Consultedand appreciate Dr. Valora Corporal evaluation; -Psychiatry recommended no addition of medications -Will need outpatient Services and follow up with Psychiatry as an outpatient.   Discharge Instructions  Discharge Instructions    Call MD for:  difficulty breathing, headache or visual disturbances    Complete by:  As directed    Call MD for:  extreme fatigue    Complete by:  As directed    Call MD for:  hives    Complete by:  As directed    Call MD for:  persistant dizziness or light-headedness    Complete by:  As directed    Call MD for:  persistant nausea and vomiting    Complete by:  As directed    Call MD for:  severe uncontrolled pain    Complete by:  As directed    Call MD for:  temperature >100.4    Complete by:  As directed    Diet general    Complete by:  As directed    Discharge instructions    Complete by:  As directed    Follow up with PCP, Cardiology, and Outpatient resources and Psychiatry provided to you by Social Worker. Take all meds as prescribed. If symptoms change or worsen please return to the ED for Evaluation.   Increase activity slowly    Complete by:  As directed      Allergies as of 06/12/2016      Reactions   Peanuts [  peanut Oil] Anaphylaxis   Lactose Intolerance (gi) Nausea And Vomiting      Medication List    TAKE these medications   feeding supplement (ENSURE ENLIVE) Liqd Take 237 mLs by mouth 2 (two) times daily between meals.   multivitamin with minerals Tabs tablet Take 1 tablet by mouth daily. Start taking on:  06/13/2016   sulfamethoxazole-trimethoprim 400-80 MG tablet Commonly known as:  BACTRIM Take 1 tablet by mouth 2 (two) times daily.      Follow-up Information    Runell Gess, MD Follow up on 06/20/2016.   Specialties:  Cardiology, Radiology Why:  10:20 am New Patient Cardiology Consultation for Slow Heart Rate  Contact information: [address] [place]  [address] [phone]        Stanley Phi, MD. Call in 1 week(s).   Specialty:  Family Medicine Why:  Call to schedule an appointment within 1 week.  Contact information: [address] [place] [address] [phone]          Allergies  Allergen Reactions  . Peanuts [Peanut Oil] Anaphylaxis  . Lactose Intolerance (Gi) Nausea And Vomiting   Consultations:  Psychiatry Dr. Elsie Saas  Procedures/Studies: Dg Chest 2 View  Result Date: 06/10/2016 CLINICAL DATA:  Abnormal electrocardiogram EXAM: CHEST  2 VIEW COMPARISON:  April 06, 2004 FINDINGS: Lungs are clear. Heart size and pulmonary vascularity are normal. No adenopathy. No bone lesions. IMPRESSION: No edema or consolidation. Electronically Signed   By: Bretta Bang III M.D.   On: 06/10/2016 18:07   US Abdomen Complete  Result Date: 06/11/2016 CLINICAL DATA:  Elevated liver function tests. EXAM: ABDOMEN ULTRASOUND COMPLETE COMPARISON:  None. FINDINGS: Gallbladder: No gallstones or wall thickening visualized. No sonographic Murphy sign noted by sonographer. Common bile duct: Diameter: Normal, 2 mm. Liver: No focal lesion identified. Within normal limits in parenchymal echogenicity. IVC: No abnormality visualized. Pancreas: Not visualized due to overlying bowel gas. Spleen: Size and appearance within normal limits. Right Kidney: Length: 9.4 cm. Echogenicity within normal limits. No mass or hydronephrosis visualized. Left Kidney: Length: 9.7 cm. Echogenicity within normal limits. No mass or hydronephrosis visualized. Abdominal aorta: Partially obscured by bowel gas. Other findings: Small volume perihepatic ascites. IMPRESSION: 1. No explanation for elevated liver function tests. 2. Small volume perihepatic ascites, nonspecific. Electronically Signed   By: Jeronimo Greaves M.D.   On: 06/11/2016 15:38   ECHOCARDIOGRAM  Study Conclusions  - Left ventricle: The cavity size was normal. Wall thickness was normal. Systolic  function was normal. The estimated ejection fraction was in the range of 60% to 65%. Wall motion was normal; there were no regional wall motion abnormalities.  Subjective: Seen and examined at bedside and was doing well with no complaints. Denied any complaints. No CP or SOB. Willing to do outpatient psychiatric services.   Discharge Exam: Vitals:   06/12/16 1000 06/12/16 1200  BP: (!) 89/59 111/75  Pulse:    Resp: (!) 9 12  Temp:     Vitals:   06/12/16 0700 06/12/16 0900 06/12/16 1000 06/12/16 1200  BP:   (!) 89/59 111/75  Pulse: (!) 36     Resp: 15  (!) 9 12  Temp:  97.6 F (36.4 C)    TempSrc:      SpO2: 100%   100%  Weight:      Height:       General: Pt is alert, awake, not in acute distress; Extremely thin and cachectic Cardiovascular: Bradycardic rate, S1/S2 +,  no rubs, no gallops Respiratory: CTA bilaterally, no wheezing, no rhonchi; Patient not tachypenic or using any accessory muscles to breathe.  Abdominal: Soft, NT, ND, bowel sounds + Extremities: No edema appreciated, no cyanosis  The results of significant diagnostics from this hospitalization (including imaging, microbiology, ancillary and laboratory) are listed below for reference.    Microbiology: Recent Results (from the past 240 hour(s))  MRSA PCR Screening     Status: None   Collection Time: 06/10/16  6:30 PM  Result Value Ref Range Status   MRSA by PCR NEGATIVE NEGATIVE Final    Comment:        The GeneXpert MRSA Assay (FDA approved for NASAL specimens only), is one component of a comprehensive MRSA colonization surveillance program. It is not intended to diagnose MRSA infection nor to guide or monitor treatment for MRSA infections.     Labs: BNP (last 3 results)  Recent Labs  06/10/16 1256  BNP 37.2   Basic Metabolic Panel:  Recent Labs Lab 06/10/16 1144 06/10/16 1256 06/11/16 0306 06/12/16 0330  NA 142 138 138 137  K 3.8 3.6 3.4* 4.3  CL 101 105 106 105  CO2 27 26 27 27    GLUCOSE 80 83 124* 79  BUN 25* 27* 24* 19  CREATININE 1.13 0.94 1.07 0.90  CALCIUM 9.1 9.0 8.5* 8.4*  MG  --  2.1  --  2.0  PHOS  --   --   --  2.6   Liver Function Tests:  Recent Labs Lab 06/10/16 1144 06/10/16 1256 06/11/16 0306 06/12/16 0330  AST 143* 128* 91* 84*  ALT 154* 144* 117* 114*  ALKPHOS 46 39 34* 34*  BILITOT 0.3 0.5 0.6 0.5  PROT 6.5 6.6 5.5* 5.5*  ALBUMIN 4.1 3.9 3.4* 3.3*   No results for input(s): LIPASE, AMYLASE in the last 168 hours. No results for input(s): AMMONIA in the last 168 hours. CBC:  Recent Labs Lab 06/10/16 1144 06/10/16 1256 06/11/16 0306 06/12/16 0330  WBC 3.1* 2.4* 2.8* 3.2*  NEUTROABS  --   --   --  0.7*  HGB  --  12.8* 11.1* 12.8*  HCT 40.4 38.0* 33.8* 37.9*  MCV 90 87.0 87.3 87.3  PLT 169 153 137* 128*   Cardiac Enzymes: No results for input(s): CKTOTAL, CKMB, CKMBINDEX, TROPONINI in the last 168 hours. BNP: Invalid input(s): POCBNP CBG: No results for input(s): GLUCAP in the last 168 hours. D-Dimer No results for input(s): DDIMER in the last 72 hours. Hgb A1c No results for input(s): HGBA1C in the last 72 hours. Lipid Profile No results for input(s): CHOL, HDL, LDLCALC, TRIG, CHOLHDL, LDLDIRECT in the last 72 hours. Thyroid function studies  Recent Labs  06/10/16 2249  TSH 1.023   Anemia work up No results for input(s): VITAMINB12, FOLATE, FERRITIN, TIBC, IRON, RETICCTPCT in the last 72 hours. Urinalysis    Component Value Date/Time   COLORURINE STRAW (A) 03/23/2016 2142   APPEARANCEUR CLEAR 03/23/2016 2142   LABSPEC 1.005 03/23/2016 2142   PHURINE 7.0 03/23/2016 2142   GLUCOSEU NEGATIVE 03/23/2016 2142   HGBUR NEGATIVE 03/23/2016 2142   BILIRUBINUR NEGATIVE 03/23/2016 2142   KETONESUR NEGATIVE 03/23/2016 2142   PROTEINUR NEGATIVE 03/23/2016 2142   NITRITE NEGATIVE 03/23/2016 2142   LEUKOCYTESUR NEGATIVE 03/23/2016 2142   Sepsis Labs Invalid input(s): PROCALCITONIN,  WBC,   LACTICIDVEN Microbiology Recent Results (from the past 240 hour(s))  MRSA PCR Screening     Status: None   Collection Time: 06/10/16  6:30  PM  Result Value Ref Range Status   MRSA by PCR NEGATIVE NEGATIVE Final    Comment:        The GeneXpert MRSA Assay (FDA approved for NASAL specimens only), is one component of a comprehensive MRSA colonization surveillance program. It is not intended to diagnose MRSA infection nor to guide or monitor treatment for MRSA infections.    Time coordinating discharge: 35 minutes  SIGNED:  Merlene Laughtermair Latif Shannon Balthazar, DO [facility] 06/12/2016, 1:30 PM Pager [phone]  If 7PM-7AM, please contact night-coverage [email or web address] Password [email or web address]

## 2016-06-16 ENCOUNTER — Telehealth: Payer: Self-pay | Admitting: Family Medicine

## 2016-06-16 NOTE — Telephone Encounter (Signed)
Pt's mother was called and states that pt BP is 76/38 and pt is having swelling in left foot, pt is not staying hydrated, and pt is not eating. Pt's mom states that he does not want to go to the ED. Pt mom feels that a call from you would push the pt to get help. Please follow up.

## 2016-06-16 NOTE — Telephone Encounter (Signed)
Called patient but had to leave a voice mail to call us back. Might need to try calling him again.

## 2016-06-16 NOTE — Telephone Encounter (Signed)
The mother of Mr. Stanley Moon called since she is concern about her son, BP is 76/38 and he does not want to go to the hospital, she is very worry and she want you to call him that way maybe he will go to the hospital. Please follow up

## 2016-06-17 ENCOUNTER — Encounter (HOSPITAL_COMMUNITY): Payer: Self-pay | Admitting: *Deleted

## 2016-06-17 ENCOUNTER — Telehealth: Payer: Self-pay | Admitting: Family Medicine

## 2016-06-17 ENCOUNTER — Emergency Department (HOSPITAL_COMMUNITY)
Admission: EM | Admit: 2016-06-17 | Discharge: 2016-06-19 | Disposition: A | Discharge status: Home / Self Care | Payer: Self-pay | Attending: Emergency Medicine | Admitting: Emergency Medicine

## 2016-06-17 DIAGNOSIS — F330 Major depressive disorder, recurrent, mild: Secondary | ICD-10-CM | POA: Diagnosis present

## 2016-06-17 DIAGNOSIS — J45909 Unspecified asthma, uncomplicated: Secondary | ICD-10-CM | POA: Insufficient documentation

## 2016-06-17 DIAGNOSIS — F329 Major depressive disorder, single episode, unspecified: Secondary | ICD-10-CM | POA: Insufficient documentation

## 2016-06-17 DIAGNOSIS — R7989 Other specified abnormal findings of blood chemistry: Secondary | ICD-10-CM

## 2016-06-17 DIAGNOSIS — F5000 Anorexia nervosa, unspecified: Secondary | ICD-10-CM | POA: Diagnosis present

## 2016-06-17 DIAGNOSIS — R945 Abnormal results of liver function studies: Secondary | ICD-10-CM | POA: Insufficient documentation

## 2016-06-17 DIAGNOSIS — Z79899 Other long term (current) drug therapy: Secondary | ICD-10-CM | POA: Insufficient documentation

## 2016-06-17 DIAGNOSIS — R630 Anorexia: Secondary | ICD-10-CM | POA: Insufficient documentation

## 2016-06-17 DIAGNOSIS — Z91010 Allergy to peanuts: Secondary | ICD-10-CM | POA: Insufficient documentation

## 2016-06-17 HISTORY — DX: Bradycardia, unspecified: R00.1

## 2016-06-17 LAB — CBC WITH DIFFERENTIAL/PLATELET
BASOS ABS: 0 10*3/uL (ref 0.0–0.1)
Basophils Relative: 1 %
Eosinophils Absolute: 0 10*3/uL (ref 0.0–0.7)
Eosinophils Relative: 1 %
HEMATOCRIT: 38.3 % — AB (ref 39.0–52.0)
HEMOGLOBIN: 12.7 g/dL — AB (ref 13.0–17.0)
LYMPHS ABS: 1.6 10*3/uL (ref 0.7–4.0)
LYMPHS PCT: 63 %
MCH: 29.2 pg (ref 26.0–34.0)
MCHC: 33.2 g/dL (ref 30.0–36.0)
MCV: 88 fL (ref 78.0–100.0)
MONOS PCT: 4 %
Monocytes Absolute: 0.1 10*3/uL (ref 0.1–1.0)
NEUTROS PCT: 31 %
Neutro Abs: 0.8 10*3/uL — ABNORMAL LOW (ref 1.7–7.7)
Platelets: 142 10*3/uL — ABNORMAL LOW (ref 150–400)
RBC: 4.35 MIL/uL (ref 4.22–5.81)
RDW: 13.3 % (ref 11.5–15.5)
WBC: 2.5 10*3/uL — AB (ref 4.0–10.5)

## 2016-06-17 LAB — COMPREHENSIVE METABOLIC PANEL
ALT: 193 U/L — ABNORMAL HIGH (ref 17–63)
ANION GAP: 7 (ref 5–15)
AST: 132 U/L — ABNORMAL HIGH (ref 15–41)
Albumin: 4 g/dL (ref 3.5–5.0)
Alkaline Phosphatase: 39 U/L (ref 38–126)
BILIRUBIN TOTAL: 0.3 mg/dL (ref 0.3–1.2)
BUN: 13 mg/dL (ref 6–20)
CO2: 26 mmol/L (ref 22–32)
Calcium: 8.7 mg/dL — ABNORMAL LOW (ref 8.9–10.3)
Chloride: 103 mmol/L (ref 101–111)
Creatinine, Ser: 0.99 mg/dL (ref 0.61–1.24)
Glucose, Bld: 87 mg/dL (ref 65–99)
POTASSIUM: 3.7 mmol/L (ref 3.5–5.1)
SODIUM: 136 mmol/L (ref 135–145)
TOTAL PROTEIN: 6.6 g/dL (ref 6.5–8.1)

## 2016-06-17 LAB — URINALYSIS, ROUTINE W REFLEX MICROSCOPIC
Bilirubin Urine: NEGATIVE
GLUCOSE, UA: NEGATIVE mg/dL
HGB URINE DIPSTICK: NEGATIVE
Ketones, ur: NEGATIVE mg/dL
Leukocytes, UA: NEGATIVE
Nitrite: NEGATIVE
PROTEIN: NEGATIVE mg/dL
SPECIFIC GRAVITY, URINE: 1.017 (ref 1.005–1.030)
pH: 8 (ref 5.0–8.0)

## 2016-06-17 LAB — MAGNESIUM: Magnesium: 2 mg/dL (ref 1.7–2.4)

## 2016-06-17 LAB — I-STAT CG4 LACTIC ACID, ED: Lactic Acid, Venous: 1.45 mmol/L (ref 0.5–1.9)

## 2016-06-17 LAB — RAPID URINE DRUG SCREEN, HOSP PERFORMED
Amphetamines: NOT DETECTED
BARBITURATES: NOT DETECTED
BENZODIAZEPINES: NOT DETECTED
COCAINE: NOT DETECTED
Opiates: NOT DETECTED
Tetrahydrocannabinol: POSITIVE — AB

## 2016-06-17 LAB — PHOSPHORUS: Phosphorus: 2.6 mg/dL (ref 2.5–4.6)

## 2016-06-17 MED ORDER — SULFAMETHOXAZOLE-TRIMETHOPRIM 400-80 MG PO TABS
1.0000 | ORAL_TABLET | Freq: Two times a day (BID) | ORAL | Status: DC
  Administered 2016-06-17 – 2016-06-19 (×3): 1 via ORAL
  Filled 2016-06-17 (×5): qty 1

## 2016-06-17 MED ORDER — ONDANSETRON HCL 4 MG PO TABS: 4.0000 mg | ORAL_TABLET | Freq: Three times a day (TID) | ORAL | Status: DC | PRN

## 2016-06-17 MED ORDER — LORAZEPAM 1 MG PO TABS: 1.0000 mg | ORAL_TABLET | Freq: Three times a day (TID) | ORAL | Status: DC | PRN

## 2016-06-17 MED ORDER — ENSURE ENLIVE PO LIQD
237.0000 mL | Freq: Two times a day (BID) | ORAL | Status: DC
  Administered 2016-06-18: 237 mL via ORAL
  Filled 2016-06-17 (×5): qty 237

## 2016-06-17 MED ORDER — PANTOPRAZOLE SODIUM 40 MG PO TBEC
40.0000 mg | DELAYED_RELEASE_TABLET | Freq: Every day | ORAL | Status: DC
  Filled 2016-06-17 (×4): qty 1

## 2016-06-17 MED ORDER — ZOLPIDEM TARTRATE 5 MG PO TABS: 5.0000 mg | ORAL_TABLET | Freq: Every evening | ORAL | Status: DC | PRN

## 2016-06-17 MED ORDER — ACETAMINOPHEN 325 MG PO TABS
650.0000 mg | ORAL_TABLET | ORAL | Status: DC | PRN
Start: 2016-06-17 — End: 2016-06-19

## 2016-06-17 MED ORDER — ADULT MULTIVITAMIN W/MINERALS CH
1.0000 | ORAL_TABLET | Freq: Every day | ORAL | Status: DC
  Administered 2016-06-17: 1 via ORAL
  Filled 2016-06-17 (×4): qty 1

## 2016-06-17 NOTE — ED Notes (Signed)
Pt ate broccol & salad from dinner tray; also ate ravioli provided by this Clinical research associatewriter.

## 2016-06-17 NOTE — ED Provider Notes (Addendum)
[facility]-EMERGENCY DEPT Provider Note   CSN: [id number] Arrival date & time: 06/17/16  1430     History   Chief Complaint Chief Complaint  Patient presents with  . IVC  . Eating Disorder    HPI Stanley Moon is a 26 y.o. male.  HPI Pt brought in by mother for psych evaluation. [facility] and they completed the IVC paperwork. Pt has a medical history of anorexia, gastritis and he was recently admitted for malnutrition. [facility] who reports that pt has not been eating like he should, and she is not home to be able to enforce the recommendations from the nutritionist. Pt reports that he is doing better, he just needs more time to show improvement. Pt has no SI/HI.    Past Medical History:  Diagnosis Date  . Anorexia   . Anxiety   . Asthma    as a child, "I haven't used inhalers in years"  . Depression   . Eczema   . Syphilis 08/29/2013    Patient Active Problem List   Diagnosis Date Noted  . Protein-calorie malnutrition, severe 06/11/2016  . Marijuana use 06/11/2016  . Hypokalemia 06/11/2016  . Hypotension 06/11/2016  . Lower extremity edema 06/11/2016  . Depression 06/11/2016  . Furuncle of chest wall 06/10/2016  . Bradycardia 06/10/2016  . Elevated LFTs 06/10/2016  . Gastritis 06/10/2016  . Underweight 12/21/2015  . Anorexia nervosa 11/05/2015    Past Surgical History:  Procedure Laterality Date  . EXTERNAL EAR SURGERY         Home Medications    Prior to Admission medications   Medication Sig Start Date End Date Taking? Authorizing Provider  Multiple Vitamin (MULTIVITAMIN WITH MINERALS) TABS tablet Take 1 tablet by mouth daily. Patient taking differently: Take 1 tablet by mouth at bedtime.  06/13/16  Yes Sheikh, Omair Latif, DO  sulfamethoxazole-trimethoprim (BACTRIM) 400-80 MG tablet Take 1 tablet by mouth 2 (two) times daily. 06/10/16  Yes Dessa PhiFunches, Josalyn, MD    Family History No family history on file.  Social  History Social History  Substance Use Topics  . Smoking status: Never Smoker  . Smokeless tobacco: Never Used  . Alcohol use No     Allergies   Peanuts [peanut oil] and Lactose intolerance (gi)   Review of Systems Review of Systems  Constitutional: Negative for activity change and appetite change.  Respiratory: Negative for cough and shortness of breath.   Cardiovascular: Negative for chest pain.  Gastrointestinal: Negative for abdominal pain.  Genitourinary: Negative for dysuria.  Psychiatric/Behavioral: Negative for self-injury and suicidal ideas. The patient is not nervous/anxious.      Physical Exam Updated Vital Signs BP 108/71 (BP Location: Left Arm)   Pulse (!) 44   Temp 97.6 F (36.4 C) (Oral)   Resp 14   SpO2 100%   Physical Exam  Constitutional: He is oriented to person, place, and time. He appears well-developed.  HENT:  Head: Atraumatic.  Neck: Neck supple.  Cardiovascular: Normal rate.   Pulmonary/Chest: Effort normal.  Neurological: He is alert and oriented to person, place, and time.  Skin: Skin is warm.  Psychiatric: He has a normal mood and affect. His behavior is normal. Thought content normal.  Nursing note and vitals reviewed.    ED Treatments / Results  Labs (all labs ordered are listed, but only abnormal results are displayed) Labs Reviewed  COMPREHENSIVE METABOLIC PANEL - Abnormal; Notable for the following:  Result Value   Calcium 8.7 (*)    AST 132 (*)    ALT 193 (*)    All other components within normal limits  CBC WITH DIFFERENTIAL/PLATELET - Abnormal; Notable for the following:    WBC 2.5 (*)    Hemoglobin 12.7 (*)    HCT 38.3 (*)    Platelets 142 (*)    Neutro Abs 0.8 (*)    All other components within normal limits  RAPID URINE DRUG SCREEN, HOSP PERFORMED - Abnormal; Notable for the following:    Tetrahydrocannabinol POSITIVE (*)    All other components within normal limits  URINALYSIS, ROUTINE W REFLEX MICROSCOPIC  - Abnormal; Notable for the following:    APPearance CLOUDY (*)    All other components within normal limits  MAGNESIUM  PHOSPHORUS  I-STAT CG4 LACTIC ACID, ED    EKG  EKG Interpretation None      Date: 06/17/2016  Rate: 40  Rhythm: normal sinus rhythm  QRS Axis: normal  Intervals: normal  ST/T Wave abnormalities: normal  Conduction Disutrbances: none  Narrative Interpretation: unremarkable     Radiology No results found.  Procedures Procedures (including critical care time)  Medications Ordered in ED Medications  ondansetron (ZOFRAN) tablet 4 mg (not administered)  acetaminophen (TYLENOL) tablet 650 mg (not administered)  LORazepam (ATIVAN) tablet 1 mg (not administered)  zolpidem (AMBIEN) tablet 5 mg (not administered)  sulfamethoxazole-trimethoprim (BACTRIM,SEPTRA) 400-80 MG per tablet 1 tablet (not administered)  multivitamin with minerals tablet 1 tablet (1 tablet Oral Given 06/17/16 1851)  pantoprazole (PROTONIX) EC tablet 40 mg (40 mg Oral Refused 06/17/16 1852)  feeding supplement (ENSURE ENLIVE) (ENSURE ENLIVE) liquid 237 mL (not administered)     Initial Impression / Assessment and Plan / ED Course  I have reviewed the triage vital signs and the nursing notes.  Pertinent labs & imaging results that were available during my care of the patient were reviewed by me and considered in my medical decision making (see chart for details).  Clinical Course as of Jun 18 1939  Tue Jun 17, 2016  1915 Case management saw the patient. Mother was not in the room  and pt refused any home help. I have called and left a message for the mother.   [AN]  1918 Registered Dietician note is as below: INTERVENTION:  - Will order Ensure Enlive BID, each supplement provides 350 kcal and 20 grams of protein. - Will order daily multivitamin with minerals. - Continue to encourage PO intakes of meals and supplements. - RD will continue to monitor for additional nutrition-related  needs.  [AN]    Clinical Course User Index [AN] Derwood Kaplan, MD    Pt comes in with cc of malnutrition. Mother thinks that pt needs inpatient treatment for malnutrition and she is concerned for overall well being of her son. She noted that pt's BP and HR were low at home. Mr. Guidroz reports that he was just discharged 4 days ago, since then he has had no weight loww, his leg swelling is better, and he just needs time to show improvement. He is hesitant at seeing nutritionist. He has no acute psychiatric complains.  I will consult SW. [facility] sent pt here, there is no note - but they IVC him - so we will get psych team to assess.  Pt's LFTs are slightly elevated. His HR is the 40s and 50s. He had bradycardia last visit - and he is to see Cards later this week. Pt has no  acute symptoms with dizziness. Medically cleared for psych.  Case management consulted - pt will benefit from home health.  Final Clinical Impressions(s) / ED Diagnoses   Final diagnoses:  Anorexia  Elevated LFTs    New Prescriptions New Prescriptions   No medications on file     Derwood Kaplan, MD 06/17/16 Judith Blonder, MD 06/17/16 Judith Blonder, MD 06/17/16 1610    Derwood Kaplan, MD 06/17/16 1941

## 2016-06-17 NOTE — ED Notes (Signed)
Pt sitting in bed; head covered with sheet.

## 2016-06-17 NOTE — ED Triage Notes (Addendum)
[facility] due to pt not eating, drinking or taking his medication. [facility] due to low HR. Pt denies SI/HI.  Pt was discharged from hospital 4 days ago, was admitted for bradycardia/anorexia.

## 2016-06-17 NOTE — ED Notes (Signed)
Pt offered department rules d/t stating "when I was in ICU, I could order my food up until 1830."  Pt refused photocopy of rules stating "I don't need it.  I'm good."

## 2016-06-17 NOTE — Care Management (Signed)
ED CM spoke with the patient at bedside. Discussed the recommendation for home health RN visits. CM explained what a home health RN would do when visiting and how often the visits would likely occur. Patient declines. States he will continue to follow-up with his PCP. Informed the patient if he later decides he would like HH services, his PCP can assist with ordering services. He verbalizes understanding. Physicist, medicalCrystal Earnie Bechard RN BSN CCM

## 2016-06-17 NOTE — Telephone Encounter (Signed)
Will route to PCP 

## 2016-06-17 NOTE — Telephone Encounter (Signed)
Called to patient, no answer Requested a call back Left VM Advised Gatorade and Powerade to help with hydration Advised 3 meals per day  Called patient's mother Christel MormonZee. She has called the police, [facility], per her report he is eating still only once per day at 2 AM, counting calories, e

## 2016-06-17 NOTE — ED Notes (Signed)
Patient refused his blood draw.

## 2016-06-17 NOTE — Telephone Encounter (Signed)
Continued  Counting calories, exercising. She reports that his mouth is dry and he feet swelling has worsened. She is worried that he will die. She believes he needs prolonged in patient care preferably at an eating disorder clinic.  She request letter with recommendations.  Letter written and ready for pick up

## 2016-06-17 NOTE — ED Notes (Addendum)
Pt stated "I don't like how people ask me questions.  I'm not a psych patient.  I eat when I want.  I've been homeless, I've slept on benches, I've eaten out of dumpsters, I've eaten food when it's cold."  Pt informed EDP has ordered Ensure.  Pt stated "I'll take it but is it a supplement or replacing a meal?'  Pt informed it has been ordered as supplement. Pt encouraged to eat dinner tray (fish removed from tray due sitting out too long).   Dinner reheated.   Pt offered other food sources (ie, ravioli, mac n' cheese, soup).  Pt stated "I'm lactose intolerant."  Pt provided water & ravioli as requested.  Pt does not make eye contact during conversation.

## 2016-06-17 NOTE — BH Assessment (Addendum)
Assessment Note  Stanley Moon is an 26 y.o. male with history of Anorexia, Anxiety, and depression. [facility] his mother Stanley Moon(Stanley Moon [phone]). IVC reads: "He is a danger to self and/or others. He is not eating or drinking anything. [facility] The doctor there told mom to have him committed there again. No taking his meds. He is diagnosed with anorexia nervosa and chronic depression. Doctors said has abnormal heart beats, Low BMI, and other abnormal test results. Smokes Pot daily.". Patient denies suicidal ideations. No history of suicide attempts or gestures. He denies self mutilating behaviors. He does admit to depression. He tends to isolate himself from others and sometimes feels hopeless. He sts that that his mother placing him under commitment is becoming a stressor for him. He was also recently homeless but sts that he is now living with his grandmother and this has help lift his spirits. He admits that he as battled with anorexia over the years. Sts that he is trying to work on his appetite and has done well with a nutritionist in the pat. Patient admits that he doesn't eat a lot but is trying to work on this issue. He typically eats one meal per day and is trying to increased his meal intake when he can. He denies HI. He has no history of aggressive or assaultive behaviors. He denies alcohol use. He admits to regular use of THC. [facility] past. Last visit was 02/2016. [facility] for depression and anorexia.     Diagnosis: Depression and Anxiety  Past Medical History:  Past Medical History:  Diagnosis Date  . Anorexia   . Anxiety   . Asthma    as a child, "I haven't used inhalers in years"  . Depression   . Eczema   . Syphilis 08/29/2013    Past Surgical History:  Procedure Laterality Date  . EXTERNAL EAR SURGERY      Family History: No family history on  file.  Social History:  reports that he has never smoked. He has never used smokeless tobacco. He reports that he does not drink alcohol or use drugs.  Additional Social History:  Alcohol / Drug Use Pain Medications: Denies use Prescriptions: Denies use Over the Counter: Denies abuse History of alcohol / drug use?: Yes Longest period of sobriety (when/how long): NA Withdrawal Symptoms: Other (Comment) Substance #1 Name of Substance 1: THC 1 - Age of First Use: teens 1 - Amount (size/oz): varies 1 - Frequency: "almost everyday" 1 - Duration: on-going  1 - Last Use / Amount: 06/16/2016  CIWA: CIWA-Ar BP: 108/71 Pulse Rate: (!) 44 COWS:    Allergies:  Allergies  Allergen Reactions  . Peanuts [Peanut Oil] Anaphylaxis  . Lactose Intolerance (Gi) Nausea And Vomiting    Home Medications:  (Not in a hospital admission)  OB/GYN Status:  No LMP for male patient.  General Assessment Data [facility] of Assessment: [facility] TTS Assessment: In system Is this a Tele or Face-to-Face Assessment?: Face-to-Face Is this an Initial Assessment or a Re-assessment for this encounter?: Initial Assessment Marital status: Single Maiden name:  (n/a) Is patient pregnant?: No Pregnancy Status: No Living Arrangements: Other relatives (grandmother) Can pt return to current living arrangement?: Yes Admission Status: Involuntary Is patient capable of signing voluntary admission?: Yes Referral Source: Self/Family/Friend Insurance type:  (Self Pay )     Crisis Care  Plan Living Arrangements: Other relatives (grandmother) Legal Guardian: Other: (no legal guardian ) Name of Psychiatrist:  Museum/gallery curator ) Name of Therapist:  Museum/gallery curator )  Education Status Is patient currently in school?: No Current Grade:  (n/a) Highest grade of school patient has completed:  (HS ) Name of school:  (n/a) Contact person:  (n/a)  Risk to self with the past 6 months Suicidal Ideation: No Has patient been a risk to self  within the past 6 months prior to admission? : No Suicidal Intent: No Has patient had any suicidal intent within the past 6 months prior to admission? : No Is patient at risk for suicide?: No Suicidal Plan?: No Has patient had any suicidal plan within the past 6 months prior to admission? : No Access to Means: No What has been your use of drugs/alcohol within the last 12 months?:  (thc ) Previous Attempts/Gestures: No How many times?:  (0) Other Self Harm Risks:  (no self harm risk; no self mutilating ) Triggers for Past Attempts:  (no past attempts or gestures ) Intentional Self Injurious Behavior: None Family Suicide History: Unknown Recent stressful life event(s): Other (Comment) ("My mom keeps IVC'ing me") Persecutory voices/beliefs?: No Depression: Yes Depression Symptoms: Loss of interest in usual pleasures, Isolating Substance abuse history and/or treatment for substance abuse?: No Suicide prevention information given to non-admitted patients: Not applicable  Risk to Others within the past 6 months Homicidal Ideation: No Thoughts of Harm to Others: No Current Homicidal Intent: No Current Homicidal Plan: No Access to Homicidal Means: No Identified Victim:  (n/a) History of harm to others?: No Assessment of Violence: None Noted Violent Behavior Description:  (patient is calm and cooperative ) Does patient have access to weapons?: No Criminal Charges Pending?: No Does patient have a court date: No Is patient on probation?: No  Psychosis Hallucinations: None noted Delusions: None noted  Mental Status Report Appearance/Hygiene: In scrubs Eye Contact: Good Motor Activity: Freedom of movement Speech: Logical/coherent Level of Consciousness: Alert Mood: Other (Comment) (appropriate ) Affect: Appropriate to circumstance Anxiety Level: Minimal Thought Processes: Relevant Judgement: Unimpaired Orientation: Time, Situation, Place, Person Obsessive Compulsive  Thoughts/Behaviors: None  Cognitive Functioning Memory: Recent Intact, Remote Intact IQ: Average Insight: Good Impulse Control: Fair Appetite: Fair Weight Loss:  (pt weighs 102 pounds; no wt. loss/gain in last month ) Weight Gain:  (denies ) Sleep: No Change Total Hours of Sleep:  (7 hrs per night ) Vegetative Symptoms: None  ADLScreening [facility] Assessment Services) Patient's cognitive ability adequate to safely complete daily activities?: Yes Patient able to express need for assistance with ADLs?: Yes Independently performs ADLs?: Yes (appropriate for developmental age)  Prior Inpatient Therapy Prior Inpatient Therapy: Yes Prior Therapy Dates:  [facility] October 1-12, 2017) Prior Therapy Facilty/Provider(s):  [facility]) Reason for Treatment:  (depression and anorexia )  Prior Outpatient Therapy Prior Outpatient Therapy: Yes Prior Therapy Dates:  (last visit to [facility] 02/2016) Prior Therapy Facilty/Provider(s):  [facility] ) Reason for Treatment:  (medication managment ) Does patient have an ACCT team?: No Does patient have Intensive In-House Services?  : No Does patient have [facility] services? : No Does patient have P4CC services?: No  ADL Screening (condition at time of admission) Patient's cognitive ability adequate to safely complete daily activities?: Yes Is the patient deaf or have difficulty hearing?: No Does the patient have difficulty seeing, even when wearing glasses/contacts?: No Does the patient have difficulty concentrating, remembering, or making decisions?: No Patient able to express need for assistance with  ADLs?: Yes Does the patient have difficulty dressing or bathing?: No Independently performs ADLs?: Yes (appropriate for developmental age) Does the patient have difficulty walking or climbing stairs?: No Weakness of Legs: None Weakness of Arms/Hands: None  Home Assistive Devices/Equipment Home Assistive Devices/Equipment: None    Abuse/Neglect Assessment  (Assessment to be complete while patient is alone) Physical Abuse: Denies Verbal Abuse: Denies Sexual Abuse: Denies Exploitation of patient/patient's resources: Denies Self-Neglect: Denies Values / Beliefs Cultural Requests During Hospitalization: None Spiritual Requests During Hospitalization: None   Advance Directives (For Healthcare) Does Patient Have a Medical Advance Directive?: No Would patient like information on creating a medical advance directive?: No - Patient declined Nutrition Screen- [facility] Patient's home diet: Regular  Additional Information 1:1 In Past 12 Months?: No CIRT Risk: No Elopement Risk: No Does patient have medical clearance?: Yes     Disposition: Per Nanine Means, DNP, patient meets criteria for INPT treatment. TTS to seek placement.  Disposition Initial Assessment Completed for this Encounter: Yes Disposition of Patient: Other dispositions  On Site Evaluation by:   Reviewed with Physician:    Melynda Ripple 06/17/2016 6:43 PM

## 2016-06-17 NOTE — ED Notes (Signed)
Bed: WA27 Expected date:  Expected time:  Means of arrival:  Comments: [facility] from [facility]

## 2016-06-17 NOTE — Telephone Encounter (Signed)
Patient's mother called the office asking for son to be put on provider's schedule today due to his condition although patient has an upcoming appt. Spoke with provider and she advised for patient to go to ED or Urgent care. No availability today. Provider will contact pt via phone later today.

## 2016-06-18 ENCOUNTER — Encounter: Payer: Self-pay | Admitting: Family Medicine

## 2016-06-18 DIAGNOSIS — F330 Major depressive disorder, recurrent, mild: Secondary | ICD-10-CM

## 2016-06-18 LAB — PATHOLOGIST SMEAR REVIEW

## 2016-06-18 MED ORDER — ENSURE PRE-SURGERY PO LIQD: 296.0000 mL | Freq: Once | ORAL | Status: DC

## 2016-06-18 MED ORDER — MIRTAZAPINE 7.5 MG PO TABS
7.5000 mg | ORAL_TABLET | Freq: Every day | ORAL | Status: DC
  Filled 2016-06-18: qty 1

## 2016-06-18 NOTE — Progress Notes (Signed)
CSW spoke with patients mother regarding questions for Medicaid/guardianship/treatment options. Mother asked for Medicaid application for patient- CSW will provide application for patient/ family once discharged. Mother asked for information regarding guardianship for patient- CSW informed mother that patient would need to be found not-competent by a judge to determine if a legal guardian would be needed. Mother asked if CSW knew of any eating disorder treatment programs in the area- CSW provided her with information for [facility] serves male and females however they only accept private insurance or private pay.   Stacy GardnerErin Abran Gavigan, LCSWA Clinical Social Worker [phone]

## 2016-06-18 NOTE — ED Notes (Signed)
Patient refused medication and stated he would not take the medication.

## 2016-06-18 NOTE — ED Notes (Signed)
Pt ambulated to the BR with steady gait.  Continues to cover his head with blanket in the bed.  He did not touch his dinner tray, but drank some water

## 2016-06-18 NOTE — ED Notes (Signed)
Pt has blanket over his head and would not take it off to talk to this nurse.  However, pt verbalized that he did not need any assistance at this time and will notify the staff if he does.

## 2016-06-18 NOTE — ED Notes (Signed)
Pt does not want Mother or Grandmother getting ANY information about him.

## 2016-06-18 NOTE — ED Provider Notes (Signed)
9:21 PM Briefly spoke with pt. He has no new complaints. HR noted. Seems chronic. Most likely from anorexia. Doesn't seem symptomatic with it. Has food tray at bedside but hasn't touched it. Offered to get him take out from somewhere but he politely declined. He is will to try drinking some Ensure. Ordered.    Raeford RazorKohut, Dawnita Molner, MD 06/18/16 2124

## 2016-06-18 NOTE — Consult Note (Signed)
[place] Psychiatry Consult   Reason for Consult:  Psychiatry evaluation Referring Physician:  EDP Patient Identification: Stanley Moon MRN:  [id number] Principal Diagnosis: Depression, major, recurrent, mild ([place]) Diagnosis:   Patient Active Problem List   Diagnosis Date Noted  . Depression, major, recurrent, mild ([place]) [F33.0] 06/18/2016  . Protein-calorie malnutrition, severe [E43] 06/11/2016  . Marijuana use [F12.90] 06/11/2016  . Hypokalemia [E87.6] 06/11/2016  . Hypotension [I95.9] 06/11/2016  . Lower extremity edema [R60.0] 06/11/2016  . Depression [F32.9] 06/11/2016  . Furuncle of chest wall [L02.223] 06/10/2016  . Bradycardia [R00.1] 06/10/2016  . Elevated LFTs [R79.89] 06/10/2016  . Gastritis [K29.70] 06/10/2016  . Underweight [R63.6] 12/21/2015  . Anorexia nervosa [F50.00] 11/05/2015    Total Time spent with patient: 45 minutes  Subjective:   Stanley Moon is a 26 y.o. male patient admitted under commitment paper brought out by his mother due allegation of not eating or taking medications.  HPI:  Connie reports history of Major depression, Anxiety disorder and  Anorexia, Anxiety.  He was brought to [place] by his mother Kenlee Maler [phone]). IVC reads: "He is a danger to self and/or others. He is not eating or drinking anything. He has been committed before to [facility]. The doctor there told mom to have him committed there again. No taking his meds. He is diagnosed with anorexia nervosa and chronic depression. Doctors said has abnormal heart beats, Low BMI, and other abnormal test results. Smokes Pot daily." However, patient denies all the allegations. He states that he only eat certain food due to having Gastritis which he claimed he inherited from his mother. Patient denies SI/HI/ delusional thinking or psychosis. He denies alcohol use but smokes Cannabis regularly.  Past Psychiatric History: as above  Risk to Self: Suicidal Ideation: No Suicidal  Intent: No Is patient at risk for suicide?: No Suicidal Plan?: No Access to Means: No What has been your use of drugs/alcohol within the last 12 months?:  (thc ) How many times?:  (0) Other Self Harm Risks:  (no self harm risk; no self mutilating ) Triggers for Past Attempts:  (no past attempts or gestures ) Intentional Self Injurious Behavior: None Risk to Others: Homicidal Ideation: No Thoughts of Harm to Others: No Current Homicidal Intent: No Current Homicidal Plan: No Access to Homicidal Means: No Identified Victim:  (n/a) History of harm to others?: No Assessment of Violence: None Noted Violent Behavior Description:  (patient is calm and cooperative ) Does patient have access to weapons?: No Criminal Charges Pending?: No Does patient have a court date: No Prior Inpatient Therapy: Prior Inpatient Therapy: Yes Prior Therapy Dates:  [facility] October 1-12, 2017) Prior Therapy Facilty/Provider(s):  [facility]) Reason for Treatment:  (depression and anorexia ) Prior Outpatient Therapy: Prior Outpatient Therapy: Yes Prior Therapy Dates:  (last visit to [facility] 02/2016) Prior Therapy Facilty/Provider(s):  [facility] ) Reason for Treatment:  (medication managment ) Does patient have an ACCT team?: No Does patient have Intensive In-House Services?  : No Does patient have [facility] services? : No Does patient have P4CC services?: No  Past Medical History:  Past Medical History:  Diagnosis Date  . Anorexia   . Anxiety   . Asthma    as a child, "I haven't used inhalers in years"  . Depression   . Eczema   . Syphilis 08/29/2013    Past Surgical History:  Procedure Laterality Date  . EXTERNAL EAR SURGERY     Family History: No family history on file. Family  Psychiatric  History: as above Social History:  History  Alcohol Use No     History  Drug Use No    Social History   Social History  . Marital status: Single    Spouse name: N/A  . Number of children: N/A  . Years of  education: N/A   Social History Main Topics  . Smoking status: Never Smoker  . Smokeless tobacco: Never Used  . Alcohol use No  . Drug use: No  . Sexual activity: Not Currently    Birth control/ protection: Condom     Comment: women    Other Topics Concern  . None   Social History Narrative  . None   Additional Social History:    Allergies:   Allergies  Allergen Reactions  . Peanuts [Peanut Oil] Anaphylaxis  . Lactose Intolerance (Gi) Nausea And Vomiting    Labs:  Results for orders placed or performed during the hospital encounter of 06/17/16 (from the past 48 hour(s))  Urine rapid drug screen (hosp performed)not at [facility]     Status: Abnormal   Collection Time: 06/17/16  3:07 PM  Result Value Ref Range   Opiates NONE DETECTED NONE DETECTED   Cocaine NONE DETECTED NONE DETECTED   Benzodiazepines NONE DETECTED NONE DETECTED   Amphetamines NONE DETECTED NONE DETECTED   Tetrahydrocannabinol POSITIVE (A) NONE DETECTED   Barbiturates NONE DETECTED NONE DETECTED    Comment:        DRUG SCREEN FOR MEDICAL PURPOSES ONLY.  IF CONFIRMATION IS NEEDED FOR ANY PURPOSE, NOTIFY LAB WITHIN 5 DAYS.        LOWEST DETECTABLE LIMITS FOR URINE DRUG SCREEN Drug Class       Cutoff (ng/mL) Amphetamine      1000 Barbiturate      200 Benzodiazepine   254 Tricyclics       270 Opiates          300 Cocaine          300 THC              50   Urinalysis, Routine w reflex microscopic     Status: Abnormal   Collection Time: 06/17/16  3:07 PM  Result Value Ref Range   Color, Urine YELLOW YELLOW   APPearance CLOUDY (A) CLEAR   Specific Gravity, Urine 1.017 1.005 - 1.030   pH 8.0 5.0 - 8.0   Glucose, UA NEGATIVE NEGATIVE mg/dL   Hgb urine dipstick NEGATIVE NEGATIVE   Bilirubin Urine NEGATIVE NEGATIVE   Ketones, ur NEGATIVE NEGATIVE mg/dL   Protein, ur NEGATIVE NEGATIVE mg/dL   Nitrite NEGATIVE NEGATIVE   Leukocytes, UA NEGATIVE NEGATIVE  Comprehensive metabolic panel     Status:  Abnormal   Collection Time: 06/17/16  4:39 PM  Result Value Ref Range   Sodium 136 135 - 145 mmol/L   Potassium 3.7 3.5 - 5.1 mmol/L   Chloride 103 101 - 111 mmol/L   CO2 26 22 - 32 mmol/L   Glucose, Bld 87 65 - 99 mg/dL   BUN 13 6 - 20 mg/dL   Creatinine, Ser 0.99 0.61 - 1.24 mg/dL   Calcium 8.7 (L) 8.9 - 10.3 mg/dL   Total Protein 6.6 6.5 - 8.1 g/dL   Albumin 4.0 3.5 - 5.0 g/dL   AST 132 (H) 15 - 41 U/L   ALT 193 (H) 17 - 63 U/L   Alkaline Phosphatase 39 38 - 126 U/L   Total Bilirubin 0.3 0.3 - 1.2 mg/dL  GFR calc non Af Amer >60 >60 mL/min   GFR calc Af Amer >60 >60 mL/min    Comment: (NOTE) The eGFR has been calculated using the CKD EPI equation. This calculation has not been validated in all clinical situations. eGFR's persistently <60 mL/min signify possible Chronic Kidney Disease.    Anion gap 7 5 - 15  CBC with Diff     Status: Abnormal   Collection Time: 06/17/16  4:39 PM  Result Value Ref Range   WBC 2.5 (L) 4.0 - 10.5 K/uL   RBC 4.35 4.22 - 5.81 MIL/uL   Hemoglobin 12.7 (L) 13.0 - 17.0 g/dL   HCT 38.3 (L) 39.0 - 52.0 %   MCV 88.0 78.0 - 100.0 fL   MCH 29.2 26.0 - 34.0 pg   MCHC 33.2 30.0 - 36.0 g/dL   RDW 13.3 11.5 - 15.5 %   Platelets 142 (L) 150 - 400 K/uL   Neutrophils Relative % 31 %   Lymphocytes Relative 63 %   Monocytes Relative 4 %   Eosinophils Relative 1 %   Basophils Relative 1 %   Neutro Abs 0.8 (L) 1.7 - 7.7 K/uL   Lymphs Abs 1.6 0.7 - 4.0 K/uL   Monocytes Absolute 0.1 0.1 - 1.0 K/uL   Eosinophils Absolute 0.0 0.0 - 0.7 K/uL   Basophils Absolute 0.0 0.0 - 0.1 K/uL   Smear Review MORPHOLOGY UNREMARKABLE   Magnesium     Status: None   Collection Time: 06/17/16  4:39 PM  Result Value Ref Range   Magnesium 2.0 1.7 - 2.4 mg/dL  Phosphorus     Status: None   Collection Time: 06/17/16  4:39 PM  Result Value Ref Range   Phosphorus 2.6 2.5 - 4.6 mg/dL  I-Stat CG4 Lactic Acid, ED     Status: None   Collection Time: 06/17/16  4:54 PM  Result  Value Ref Range   Lactic Acid, Venous 1.45 0.5 - 1.9 mmol/L    Current Facility-Administered Medications  Medication Dose Route Frequency Provider Last Rate Last Dose  . acetaminophen (TYLENOL) tablet 650 mg  650 mg Oral Q4H PRN Nanavati, Ankit, MD      . feeding supplement (ENSURE ENLIVE) (ENSURE ENLIVE) liquid 237 mL  237 mL Oral BID BM Nanavati, Ankit, MD      . LORazepam (ATIVAN) tablet 1 mg  1 mg Oral Q8H PRN Nanavati, Ankit, MD      . mirtazapine (REMERON) tablet 7.5 mg  7.5 mg Oral QHS Abdulai Blaylock, MD      . multivitamin with minerals tablet 1 tablet  1 tablet Oral Daily Varney Biles, MD   1 tablet at 06/17/16 1851  . ondansetron (ZOFRAN) tablet 4 mg  4 mg Oral Q8H PRN Nanavati, Ankit, MD      . pantoprazole (PROTONIX) EC tablet 40 mg  40 mg Oral Daily Nanavati, Ankit, MD      . sulfamethoxazole-trimethoprim (BACTRIM,SEPTRA) 400-80 MG per tablet 1 tablet  1 tablet Oral BID Varney Biles, MD   1 tablet at 06/17/16 2301   Current Outpatient Prescriptions  Medication Sig Dispense Refill  . Multiple Vitamin (MULTIVITAMIN WITH MINERALS) TABS tablet Take 1 tablet by mouth daily. (Patient taking differently: Take 1 tablet by mouth at bedtime. ) 30 tablet -0  . sulfamethoxazole-trimethoprim (BACTRIM) 400-80 MG tablet Take 1 tablet by mouth 2 (two) times daily. 14 tablet 0    Musculoskeletal: Strength & Muscle Tone: within normal limits Gait & Station: normal Patient leans: N/A  Psychiatric Specialty Exam: Physical Exam  Psychiatric: His speech is normal and behavior is normal. Judgment and thought content normal. His mood appears anxious. Cognition and memory are normal.    Review of Systems  Constitutional: Positive for malaise/fatigue.  HENT: Negative.   Eyes: Negative.   Cardiovascular: Negative.   Skin: Negative.     Blood pressure (!) 100/58, pulse (!) 42, temperature 98.4 F (36.9 C), temperature source Oral, resp. rate 16, SpO2 100 %.There is no height or weight  on file to calculate BMI.  General Appearance: Casual  Eye Contact:  Good  Speech:  Clear and Coherent  Volume:  Normal  Mood:  Dysphoric  Affect:  Constricted  Thought Process:  Coherent and Descriptions of Associations: Intact  Orientation:  Full (Time, Place, and Person)  Thought Content:  Logical  Suicidal Thoughts:  No  Homicidal Thoughts:  No  Memory:  Immediate;   Fair Recent;   Fair Remote;   Fair  Judgement:  Fair  Insight:  Fair  Psychomotor Activity:  Normal  Concentration:  Concentration: Fair and Attention Span: Fair  Recall:  Good  Fund of Knowledge:  Good  Language:  Good  Akathisia:  No  Handed:  Right  AIMS (if indicated):     Assets:  Communication Skills Desire for Improvement  ADL's:  Intact  Cognition:  WNL  Sleep:   fair     Treatment Plan Summary: Daily contact with patient to assess and evaluate symptoms and progress in treatment and Medication management  Start Remeron 7.5 mg daily at [facility] for depression  Disposition: Recommend psychiatric Inpatient admission when medically cleared.                       Will re-evaluate patient in am if he does not get admitted to inpatient today.  Corena Pilgrim, MD 06/18/2016 11:41 AM

## 2016-06-19 ENCOUNTER — Encounter (HOSPITAL_COMMUNITY): Payer: Self-pay

## 2016-06-19 ENCOUNTER — Telehealth: Payer: Self-pay | Admitting: Family Medicine

## 2016-06-19 MED ORDER — MIRTAZAPINE 7.5 MG PO TABS: 7.5000 mg | ORAL_TABLET | Freq: Every day | ORAL | 0 refills | Status: DC

## 2016-06-19 NOTE — Progress Notes (Signed)
CSW provided patient with bus pass. Patients family is unable to pick him up until after 2PM. Patient requested to sit in lobby until family able to pick up. CSW will notify security.   Stacy GardnerErin Catarina Huntley, LCSWA Clinical Social Worker [phone]

## 2016-06-19 NOTE — Consult Note (Signed)
[facility] Consult   Reason for Consult:  Depression  Referring Physician:  EDP Patient Identification: Stanley Moon MRN:  [id number] Principal Diagnosis: Depression, major, recurrent, mild ([place]) Diagnosis:   Patient Active Problem List   Diagnosis Date Noted  . Depression, major, recurrent, mild ([place]) [F33.0] 06/18/2016    Priority: High  . Protein-calorie malnutrition, severe [E43] 06/11/2016  . Marijuana use [F12.90] 06/11/2016  . Hypokalemia [E87.6] 06/11/2016  . Hypotension [I95.9] 06/11/2016  . Lower extremity edema [R60.0] 06/11/2016  . Depression [F32.9] 06/11/2016  . Furuncle of chest wall [L02.223] 06/10/2016  . Bradycardia [R00.1] 06/10/2016  . Elevated LFTs [R79.89] 06/10/2016  . Gastritis [K29.70] 06/10/2016  . Underweight [R63.6] 12/21/2015  . Anorexia nervosa [F50.00] 11/05/2015    Total Time spent with patient: 30 minutes  Subjective:   Stanley Moon is a 26 y.o. male patient does not warrant admission.  HPI:  26 yo male who was IVC'd by his mother due to poor eating, history of gastritis.  Medications were started for depression and appetite.  No suicidal/homicidal ideations, hallucinations, and alcohol/drug abuse except cannabis.  Stable for discharge with outpatient resources in place.  Past Psychiatric History: depression, anxiety  Risk to Self: Suicidal Ideation: No Suicidal Intent: No Is patient at risk for suicide?: No Suicidal Plan?: No Access to Means: No What has been your use of drugs/alcohol within the last 12 months?:  (thc ) How many times?:  (0) Other Self Harm Risks:  (no self harm risk; no self mutilating ) Triggers for Past Attempts:  (no past attempts or gestures ) Intentional Self Injurious Behavior: None Risk to Others: Homicidal Ideation: No Thoughts of Harm to Others: No Current Homicidal Intent: No Current Homicidal Plan: No Access to Homicidal Means: No Identified Victim:  (n/a) History of harm to others?:  No Assessment of Violence: None Noted Violent Behavior Description:  (patient is calm and cooperative ) Does patient have access to weapons?: No Criminal Charges Pending?: No Does patient have a court date: No Prior Inpatient Therapy: Prior Inpatient Therapy: Yes Prior Therapy Dates:  [facility] October 1-12, 2017) Prior Therapy Facilty/Provider(s):  [facility]) Reason for Treatment:  (depression and anorexia ) Prior Outpatient Therapy: Prior Outpatient Therapy: Yes Prior Therapy Dates:  (last visit to [facility] 02/2016) Prior Therapy Facilty/Provider(s):  [facility] ) Reason for Treatment:  (medication managment ) Does patient have an ACCT team?: No Does patient have Intensive In-House Services?  : No Does patient have [facility] services? : No Does patient have [facility] services?: No  Past Medical History:  Past Medical History:  Diagnosis Date  . Anorexia   . Anxiety   . Asthma    as a child, "I haven't used inhalers in years"  . Bradycardia   . Depression   . Eczema   . Syphilis 08/29/2013    Past Surgical History:  Procedure Laterality Date  . EXTERNAL EAR SURGERY     Family History: History reviewed. No pertinent family history. Family Psychiatric  History: none Social History:  History  Alcohol Use No     History  Drug Use No    Social History   Social History  . Marital status: Single    Spouse name: N/A  . Number of children: N/A  . Years of education: N/A   Social History Main Topics  . Smoking status: Never Smoker  . Smokeless tobacco: Never Used  . Alcohol use No  . Drug use: No  . Sexual activity: Not Currently  Birth control/ protection: Condom     Comment: women    Other Topics Concern  . None   Social History Narrative  . None   Additional Social History:    Allergies:   Allergies  Allergen Reactions  . Peanuts [Peanut Oil] Anaphylaxis  . Lactose Intolerance (Gi) Nausea And Vomiting    Labs:  Results for orders placed or performed during the  hospital encounter of 06/17/16 (from the past 48 hour(s))  Urine rapid drug screen (hosp performed)not at [facility]     Status: Abnormal   Collection Time: 06/17/16  3:07 PM  Result Value Ref Range   Opiates NONE DETECTED NONE DETECTED   Cocaine NONE DETECTED NONE DETECTED   Benzodiazepines NONE DETECTED NONE DETECTED   Amphetamines NONE DETECTED NONE DETECTED   Tetrahydrocannabinol POSITIVE (A) NONE DETECTED   Barbiturates NONE DETECTED NONE DETECTED    Comment:        DRUG SCREEN FOR MEDICAL PURPOSES ONLY.  IF CONFIRMATION IS NEEDED FOR ANY PURPOSE, NOTIFY LAB WITHIN 5 DAYS.        LOWEST DETECTABLE LIMITS FOR URINE DRUG SCREEN Drug Class       Cutoff (ng/mL) Amphetamine      1000 Barbiturate      200 Benzodiazepine   416 Tricyclics       606 Opiates          300 Cocaine          300 THC              50   Urinalysis, Routine w reflex microscopic     Status: Abnormal   Collection Time: 06/17/16  3:07 PM  Result Value Ref Range   Color, Urine YELLOW YELLOW   APPearance CLOUDY (A) CLEAR   Specific Gravity, Urine 1.017 1.005 - 1.030   pH 8.0 5.0 - 8.0   Glucose, UA NEGATIVE NEGATIVE mg/dL   Hgb urine dipstick NEGATIVE NEGATIVE   Bilirubin Urine NEGATIVE NEGATIVE   Ketones, ur NEGATIVE NEGATIVE mg/dL   Protein, ur NEGATIVE NEGATIVE mg/dL   Nitrite NEGATIVE NEGATIVE   Leukocytes, UA NEGATIVE NEGATIVE  Comprehensive metabolic panel     Status: Abnormal   Collection Time: 06/17/16  4:39 PM  Result Value Ref Range   Sodium 136 135 - 145 mmol/L   Potassium 3.7 3.5 - 5.1 mmol/L   Chloride 103 101 - 111 mmol/L   CO2 26 22 - 32 mmol/L   Glucose, Bld 87 65 - 99 mg/dL   BUN 13 6 - 20 mg/dL   Creatinine, Ser 0.99 0.61 - 1.24 mg/dL   Calcium 8.7 (L) 8.9 - 10.3 mg/dL   Total Protein 6.6 6.5 - 8.1 g/dL   Albumin 4.0 3.5 - 5.0 g/dL   AST 132 (H) 15 - 41 U/L   ALT 193 (H) 17 - 63 U/L   Alkaline Phosphatase 39 38 - 126 U/L   Total Bilirubin 0.3 0.3 - 1.2 mg/dL   GFR calc non Af Amer  >60 >60 mL/min   GFR calc Af Amer >60 >60 mL/min    Comment: (NOTE) The eGFR has been calculated using the CKD EPI equation. This calculation has not been validated in all clinical situations. eGFR's persistently <60 mL/min signify possible Chronic Kidney Disease.    Anion gap 7 5 - 15  CBC with Diff     Status: Abnormal   Collection Time: 06/17/16  4:39 PM  Result Value Ref Range   WBC 2.5 (L) 4.0 -  10.5 K/uL   RBC 4.35 4.22 - 5.81 MIL/uL   Hemoglobin 12.7 (L) 13.0 - 17.0 g/dL   HCT 38.3 (L) 39.0 - 52.0 %   MCV 88.0 78.0 - 100.0 fL   MCH 29.2 26.0 - 34.0 pg   MCHC 33.2 30.0 - 36.0 g/dL   RDW 13.3 11.5 - 15.5 %   Platelets 142 (L) 150 - 400 K/uL   Neutrophils Relative % 31 %   Lymphocytes Relative 63 %   Monocytes Relative 4 %   Eosinophils Relative 1 %   Basophils Relative 1 %   Neutro Abs 0.8 (L) 1.7 - 7.7 K/uL   Lymphs Abs 1.6 0.7 - 4.0 K/uL   Monocytes Absolute 0.1 0.1 - 1.0 K/uL   Eosinophils Absolute 0.0 0.0 - 0.7 K/uL   Basophils Absolute 0.0 0.0 - 0.1 K/uL   Smear Review MORPHOLOGY UNREMARKABLE   Magnesium     Status: None   Collection Time: 06/17/16  4:39 PM  Result Value Ref Range   Magnesium 2.0 1.7 - 2.4 mg/dL  Phosphorus     Status: None   Collection Time: 06/17/16  4:39 PM  Result Value Ref Range   Phosphorus 2.6 2.5 - 4.6 mg/dL  Pathologist smear review     Status: None   Collection Time: 06/17/16  4:39 PM  Result Value Ref Range   Path Review Reviewed By Violet Baldy, M.D.     Comment: 5.16.18 PANCYTOPENIA.   I-Stat CG4 Lactic Acid, ED     Status: None   Collection Time: 06/17/16  4:54 PM  Result Value Ref Range   Lactic Acid, Venous 1.45 0.5 - 1.9 mmol/L    Current Facility-Administered Medications  Medication Dose Route Frequency Provider Last Rate Last Dose  . acetaminophen (TYLENOL) tablet 650 mg  650 mg Oral Q4H PRN Nanavati, Ankit, MD      . feeding supplement (ENSURE ENLIVE) (ENSURE ENLIVE) liquid 237 mL  237 mL Oral BID BM Kathrynn Humble,  Ankit, MD   237 mL at 06/18/16 2136  . mirtazapine (REMERON) tablet 7.5 mg  7.5 mg Oral QHS Baylin Gamblin, MD      . multivitamin with minerals tablet 1 tablet  1 tablet Oral Daily Varney Biles, MD   1 tablet at 06/17/16 1851  . ondansetron (ZOFRAN) tablet 4 mg  4 mg Oral Q8H PRN Nanavati, Ankit, MD      . pantoprazole (PROTONIX) EC tablet 40 mg  40 mg Oral Daily Nanavati, Ankit, MD      . sulfamethoxazole-trimethoprim (BACTRIM,SEPTRA) 400-80 MG per tablet 1 tablet  1 tablet Oral BID Varney Biles, MD   1 tablet at 06/18/16 2126   Current Outpatient Prescriptions  Medication Sig Dispense Refill  . Multiple Vitamin (MULTIVITAMIN WITH MINERALS) TABS tablet Take 1 tablet by mouth daily. (Patient taking differently: Take 1 tablet by mouth at bedtime. ) 30 tablet -0  . sulfamethoxazole-trimethoprim (BACTRIM) 400-80 MG tablet Take 1 tablet by mouth 2 (two) times daily. 14 tablet 0    Musculoskeletal: Strength & Muscle Tone: within normal limits Gait & Station: normal Patient leans: N/A  Psychiatric Specialty Exam: Physical Exam  Constitutional: He is oriented to person, place, and time. He appears well-developed and well-nourished.  HENT:  Head: Normocephalic.  Neck: Normal range of motion.  Respiratory: Effort normal.  Musculoskeletal: Normal range of motion.  Neurological: He is alert and oriented to person, place, and time.  Psychiatric: His speech is normal and behavior is normal. Judgment and  thought content normal. Cognition and memory are normal. He exhibits a depressed mood.    Review of Systems  Psychiatric/Behavioral: Positive for depression.  All other systems reviewed and are negative.   Blood pressure 97/62, pulse (!) 42, temperature 97.6 F (36.4 C), temperature source Oral, resp. rate 16, SpO2 100 %.There is no height or weight on file to calculate BMI.  General Appearance: Casual  Eye Contact:  Good  Speech:  Normal Rate  Volume:  Normal  Mood:  Depressed   Affect:  Congruent  Thought Process:  Coherent and Descriptions of Associations: Intact  Orientation:  Full (Time, Place, and Person)  Thought Content:  WDL and Logical  Suicidal Thoughts:  No  Homicidal Thoughts:  No  Memory:  Immediate;   Good Recent;   Good Remote;   Good  Judgement:  Fair  Insight:  Fair  Psychomotor Activity:  Normal  Concentration:  Concentration: Good and Attention Span: Good  Recall:  Good  Fund of Knowledge:  Good  Language:  Good  Akathisia:  No  Handed:  Right  AIMS (if indicated):     Assets:  Housing Leisure Time Physical Health Resilience Social Support  ADL's:  Intact  Cognition:  WNL  Sleep:        Treatment Plan Summary: Daily contact with patient to assess and evaluate symptoms and progress in treatment, Medication management and Plan major depressive disorder, recurrent, mild:  -Crisis stabilization -Medication management:  Continue Remeron 7.5 mg daily at bedtime for sleep and depression along with his antibiotic for infection  -Individual counseling  Disposition: No evidence of imminent risk to self or others at present.    Waylan Boga, NP 06/19/2016 9:03 AM  Patient seen face-to-face for psychiatric evaluation, chart reviewed and case discussed with the physician extender and developed treatment plan. Reviewed the information documented and agree with the treatment plan. Corena Pilgrim, MD

## 2016-06-19 NOTE — BH Assessment (Signed)
[facility] Assessment Progress Note  Per Thedore MinsMojeed Akintayo, MD, this pt does not require psychiatric hospitalization at this time.  Pt presents under IVC initiated by his mother, which Dr Jannifer FranklinAkintayo has rescinded.  [facility].  This has been included in pt's discharge instructions.  Pt's nurse has been notified.  Doylene Canninghomas Ana Woodroof, MA Triage Specialist [phone]

## 2016-06-19 NOTE — ED Notes (Signed)
Patient refused medication except Bactrim.  Ate a fruit cup and some Cheerios for breakfast.

## 2016-06-19 NOTE — ED Notes (Signed)
Patient took shower and is now ready for discharge.  Mother and Grandmother cannot pick him up at this time.  Bus pass offered, but patient asked if he could wait in the lobby for his ride.  Social worker said that would be fine.  Patient alert, calm, and cooperative.  Asked for his Ensure to go.

## 2016-06-19 NOTE — Discharge Instructions (Signed)
For your ongoing mental health needs, you are advised to follow up with [facility].  If you do not currently have an appointment, new and returning patients are seen at their walk-in clinic.  Walk-in hours are Monday - Friday from 8:00 am - 3:00 pm.  Walk-in patients are seen on a first come, first served basis.  Try to arrive as early as possible for he best chance of being seen the same day: ° °     [facility] °     [address] °     [place], [place] [address] °     [phone] °

## 2016-06-19 NOTE — Telephone Encounter (Signed)
Thank you for the update!

## 2016-06-19 NOTE — [facility] Suicide Risk Assessment (Signed)
Suicide Risk Assessment  Discharge Assessment   [facility]   Principal Problem: Depression, major, recurrent, mild ([facility]) Discharge Diagnoses:  Patient Active Problem List   Diagnosis Date Noted  . 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 Protein-calorie malnutrition, severe [E43] 06/11/2016  . Marijuana use [F12.90] 06/11/2016  . Hypokalemia [E87.6] 06/11/2016  . Hypotension [I95.9] 06/11/2016  . Lower extremity edema [R60.0] 06/11/2016  . Depression [F32.9] 06/11/2016  . Furuncle of chest wall [L02.223] 06/10/2016  . Bradycardia [R00.1] 06/10/2016  . Elevated LFTs [R79.89] 06/10/2016  . Gastritis [K29.70] 06/10/2016  . Underweight [R63.6] 12/21/2015  . Anorexia nervosa [F50.00] 11/05/2015    Total Time spent with patient: 30 minutes   Musculoskeletal: Strength & Muscle Tone: within normal limits Gait & Station: normal Patient leans: N/A  Psychiatric Specialty Exam: Physical Exam  Constitutional: He is oriented to person, place, and time. He appears well-developed and well-nourished.  HENT:  Head: Normocephalic.  Neck: Normal range of motion.  Respiratory: Effort normal.  Musculoskeletal: Normal range of motion.  Neurological: He is alert and oriented to person, place, and time.  Psychiatric: His speech is normal and behavior is normal. Judgment and thought content normal. Cognition and memory are normal. He exhibits a depressed mood.    Review of Systems  Psychiatric/Behavioral: Positive for depression.  All other systems reviewed and are negative.   Blood pressure 97/62, pulse (!) 42, temperature 97.6 F (36.4 C), temperature source Oral, resp. rate 16, SpO2 100 %.There is no height or weight on file to calculate BMI.  General Appearance: Casual  Eye Contact:  Good  Speech:  Normal Rate  Volume:  Normal  Mood:  Depressed  Affect:  Congruent  Thought Process:  Coherent and Descriptions of  Associations: Intact  Orientation:  Full (Time, Place, and Person)  Thought Content:  WDL and Logical  Suicidal Thoughts:  No  Homicidal Thoughts:  No  Memory:  Immediate;   Good Recent;   Good Remote;   Good  Judgement:  Fair  Insight:  Fair  Psychomotor Activity:  Normal  Concentration:  Concentration: Good and Attention Span: Good  Recall:  Good  Fund of Knowledge:  Good  Language:  Good  Akathisia:  No  Handed:  Right  AIMS (if indicated):     Assets:  Housing Leisure Time Physical Health Resilience Social Support  ADL's:  Intact  Cognition:  WNL  Sleep:      Mental Status Per Nursing Assessment::   On Admission:   depression and anxiety  Demographic Factors:  Male and Adolescent or young adult  Loss Factors: NA  Historical Factors: NA  Risk Reduction Factors:   Sense of responsibility to family, Living with another person, especially a relative and Positive social support  Continued Clinical Symptoms:  Depression, mild  Cognitive Features That Contribute To Risk:  None    Suicide Risk:  Minimal: No identifiable suicidal ideation.  Patients presenting with no risk factors but with morbid ruminations; may be classified as minimal risk based on the severity of the depressive symptoms    Plan Of Care/Follow-up recommendations:  Activity:  as tolerated Diet:  heart healthy diet  LORD, JAMISON, NP 06/19/2016, 9:14 AM

## 2016-06-19 NOTE — Telephone Encounter (Signed)
The TTA Ms. Toyka called to let you know that the PT show up to the ED dept.to be admitted for 30day treatment  For eating disorder and they show the letter that you give them to the ED, Ms. Toyka informed them that [facility] not the right environment form him the [facility], no is refusing treatment or to go there, if you have any question please call her at [phone]..thanks

## 2016-06-20 ENCOUNTER — Encounter: Payer: Self-pay | Admitting: Cardiovascular Disease

## 2016-06-20 ENCOUNTER — Telehealth: Payer: Self-pay

## 2016-06-20 ENCOUNTER — Ambulatory Visit (INDEPENDENT_AMBULATORY_CARE_PROVIDER_SITE_OTHER): Payer: No Typology Code available for payment source | Admitting: Cardiovascular Disease

## 2016-06-20 ENCOUNTER — Emergency Department (HOSPITAL_COMMUNITY)
Admission: EM | Admit: 2016-06-20 | Discharge: 2016-06-21 | Disposition: A | Discharge status: Home / Self Care | Payer: No Typology Code available for payment source | Attending: Emergency Medicine | Admitting: Emergency Medicine

## 2016-06-20 ENCOUNTER — Encounter (HOSPITAL_COMMUNITY): Payer: Self-pay | Admitting: *Deleted

## 2016-06-20 DIAGNOSIS — R001 Bradycardia, unspecified: Secondary | ICD-10-CM

## 2016-06-20 DIAGNOSIS — J45909 Unspecified asthma, uncomplicated: Secondary | ICD-10-CM | POA: Insufficient documentation

## 2016-06-20 DIAGNOSIS — Z79899 Other long term (current) drug therapy: Secondary | ICD-10-CM | POA: Insufficient documentation

## 2016-06-20 DIAGNOSIS — Z046 Encounter for general psychiatric examination, requested by authority: Secondary | ICD-10-CM

## 2016-06-20 DIAGNOSIS — Z91010 Allergy to peanuts: Secondary | ICD-10-CM | POA: Insufficient documentation

## 2016-06-20 DIAGNOSIS — F5000 Anorexia nervosa, unspecified: Secondary | ICD-10-CM | POA: Diagnosis present

## 2016-06-20 DIAGNOSIS — F330 Major depressive disorder, recurrent, mild: Secondary | ICD-10-CM

## 2016-06-20 LAB — COMPREHENSIVE METABOLIC PANEL
ALBUMIN: 4.7 g/dL (ref 3.5–5.0)
ALK PHOS: 62 U/L (ref 38–126)
ALT: 214 U/L — ABNORMAL HIGH (ref 17–63)
ANION GAP: 8 (ref 5–15)
AST: 104 U/L — ABNORMAL HIGH (ref 15–41)
BILIRUBIN TOTAL: 0.4 mg/dL (ref 0.3–1.2)
BUN: 20 mg/dL (ref 6–20)
CALCIUM: 9.4 mg/dL (ref 8.9–10.3)
CO2: 32 mmol/L (ref 22–32)
CREATININE: 1.23 mg/dL (ref 0.61–1.24)
Chloride: 95 mmol/L — ABNORMAL LOW (ref 101–111)
GFR calc Af Amer: >60 mL/min (ref >60)
GFR calc non Af Amer: >60 mL/min (ref >60)
GLUCOSE: 117 mg/dL — AB (ref 65–99)
Potassium: 3.3 mmol/L — ABNORMAL LOW (ref 3.5–5.1)
Sodium: 135 mmol/L (ref 135–145)
TOTAL PROTEIN: 7.6 g/dL (ref 6.5–8.1)

## 2016-06-20 LAB — CBC
HEMATOCRIT: 40.6 % (ref 39.0–52.0)
Hemoglobin: 14.3 g/dL (ref 13.0–17.0)
MCH: 29.9 pg (ref 26.0–34.0)
MCHC: 35.2 g/dL (ref 30.0–36.0)
MCV: 84.9 fL (ref 78.0–100.0)
Platelets: 182 10*3/uL (ref 150–400)
RBC: 4.78 MIL/uL (ref 4.22–5.81)
RDW: 13.3 % (ref 11.5–15.5)
WBC: 3.7 10*3/uL — ABNORMAL LOW (ref 4.0–10.5)

## 2016-06-20 LAB — RAPID URINE DRUG SCREEN, HOSP PERFORMED
Amphetamines: NOT DETECTED
BARBITURATES: NOT DETECTED
Benzodiazepines: NOT DETECTED
Cocaine: NOT DETECTED
Opiates: NOT DETECTED
TETRAHYDROCANNABINOL: POSITIVE — AB

## 2016-06-20 LAB — ETHANOL: Alcohol, Ethyl (B): <5 mg/dL (ref <5)

## 2016-06-20 LAB — ACETAMINOPHEN LEVEL: Acetaminophen (Tylenol), Serum: <10 ug/mL — ABNORMAL LOW (ref 10–30)

## 2016-06-20 LAB — SALICYLATE LEVEL: Salicylate Lvl: <7 mg/dL (ref 2.8–30.0)

## 2016-06-20 NOTE — ED Notes (Signed)
Dr. Julieanne Mansonegler in with pt.

## 2016-06-20 NOTE — ED Notes (Signed)
TTS assessment in progress. 

## 2016-06-20 NOTE — ED Provider Notes (Signed)
[facility]-EMERGENCY DEPT Provider Note   CSN: [id number] Arrival date & time: 06/20/16  1743     History   Chief Complaint Chief Complaint  Patient presents with  . Medical Clearance    HPI Stanley Moon is a 26 y.o. male.  The history is provided by the patient and medical records (IVC paperwork).  Mental Health Problem  Presenting symptoms: no aggressive behavior, no agitation, no hallucinations, no homicidal ideas, no paranoid behavior, no self-mutilation, no suicidal thoughts, no suicidal threats and no suicide attempt   Patient accompanied by:  Law enforcement Degree of incapacity (severity):  Moderate Onset quality:  Gradual Timing:  Constant Progression:  Unable to specify Chronicity:  Chronic Context: not alcohol use, not drug abuse and not medication   Treatment compliance:  Untreated Relieved by:  Nothing Worsened by:  Nothing Ineffective treatments:  None tried Associated symptoms: no abdominal pain, no chest pain, no fatigue and no headaches   Risk factors: hx of mental illness   Risk factors: no hx of suicide attempts     Past Medical History:  Diagnosis Date  . Anorexia   . Anxiety   . Asthma    as a child, "I haven't used inhalers in years"  . Bradycardia   . Depression   . Eczema   . Syphilis 08/29/2013    Patient Active Problem List   Diagnosis Date Noted  . Depression, major, recurrent, mild (HCC) 06/18/2016  . Protein-calorie malnutrition, severe 06/11/2016  . Marijuana use 06/11/2016  . Hypokalemia 06/11/2016  . Hypotension 06/11/2016  . Lower extremity edema 06/11/2016  . Depression 06/11/2016  . Furuncle of chest wall 06/10/2016  . Bradycardia 06/10/2016  . Elevated LFTs 06/10/2016  . Gastritis 06/10/2016  . Underweight 12/21/2015  . Anorexia nervosa 11/05/2015    Past Surgical History:  Procedure Laterality Date  . EXTERNAL EAR SURGERY         Home Medications    Prior to Admission medications   Medication Sig Start Date  End Date Taking? Authorizing Provider  Multiple Vitamin (MULTIVITAMIN WITH MINERALS) TABS tablet Take 1 tablet by mouth daily. Patient taking differently: Take 1 tablet by mouth at bedtime.  06/13/16  Yes Sheikh, Omair Latif, DO  sulfamethoxazole-trimethoprim (BACTRIM) 400-80 MG tablet Take 1 tablet by mouth 2 (two) times daily. 06/10/16  Yes Dessa Phi, MD    Family History Family History  Problem Relation Age of Onset  . Hypertension Maternal Grandmother     Social History Social History  Substance Use Topics  . Smoking status: Never Smoker  . Smokeless tobacco: Never Used  . Alcohol use No     Allergies   Peanuts [peanut oil] and Lactose intolerance (gi)   Review of Systems Review of Systems  Constitutional: Positive for unexpected weight change (wt loss). Negative for activity change, chills, diaphoresis, fatigue and fever.  HENT: Negative for congestion and rhinorrhea.   Eyes: Negative for visual disturbance.  Respiratory: Negative for cough, chest tightness, shortness of breath, wheezing and stridor.   Cardiovascular: Negative for chest pain, palpitations and leg swelling.  Gastrointestinal: Negative for abdominal distention, abdominal pain, blood in stool, constipation, diarrhea, nausea and vomiting.  Genitourinary: Negative for decreased urine volume, difficulty urinating, dysuria, flank pain, frequency and testicular pain.  Musculoskeletal: Negative for back pain and gait problem.  Skin: Negative for rash and wound.  Neurological: Negative for dizziness, weakness, light-headedness, numbness and headaches.  Psychiatric/Behavioral: Negative for agitation, hallucinations, homicidal ideas, paranoia, self-injury and suicidal ideas.  All  other systems reviewed and are negative.    Physical Exam Updated Vital Signs BP 108/67 (BP Location: Left Arm)   Pulse (!) 57   Temp 98 F (36.7 C) (Oral)   Resp 14   SpO2 100%   Physical Exam  Constitutional: He is oriented  to person, place, and time. He appears cachectic. No distress.  HENT:  Head: Normocephalic and atraumatic.  Mouth/Throat: Oropharynx is clear and moist. No oropharyngeal exudate.  Eyes: Conjunctivae and EOM are normal. Pupils are equal, round, and reactive to light.  Neck: Normal range of motion. Neck supple.  Cardiovascular: Regular rhythm and intact distal pulses.  Bradycardia present.   No murmur heard. Pulmonary/Chest: Effort normal and breath sounds normal. No stridor. No respiratory distress. He has no wheezes. He exhibits no tenderness.  Abdominal: Soft. He exhibits no distension. There is no tenderness.  Musculoskeletal: He exhibits no edema.  Neurological: He is alert and oriented to person, place, and time. No cranial nerve deficit or sensory deficit. He exhibits normal muscle tone.  Skin: Skin is warm and dry. Capillary refill takes less than 2 seconds. He is not diaphoretic. No erythema. No pallor.  Psychiatric: He has a normal mood and affect.  Nursing note and vitals reviewed.    ED Treatments / Results  Labs (all labs ordered are listed, but only abnormal results are displayed) Labs Reviewed  COMPREHENSIVE METABOLIC PANEL - Abnormal; Notable for the following:       Result Value   Potassium 3.3 (*)    Chloride 95 (*)    Glucose, Bld 117 (*)    AST 104 (*)    ALT 214 (*)    All other components within normal limits  ACETAMINOPHEN LEVEL - Abnormal; Notable for the following:    Acetaminophen (Tylenol), Serum <10 (*)    All other components within normal limits  CBC - Abnormal; Notable for the following:    WBC 3.7 (*)    All other components within normal limits  RAPID URINE DRUG SCREEN, HOSP PERFORMED - Abnormal; Notable for the following:    Tetrahydrocannabinol POSITIVE (*)    All other components within normal limits  ETHANOL  SALICYLATE LEVEL    EKG  EKG Interpretation  Date/Time:  Friday Jun 20 2016 20:57:11 EDT Ventricular Rate:  58 PR Interval:      QRS Duration: 104 QT Interval:  410 QTC Calculation: 403 R Axis:   74 Text Interpretation:  Sinus rhythm When compared to prior, significant changes seen. No STEMI Confirmed by Theda Belfast ([id number]) on 06/20/2016 9:49:19 PM       Radiology No results found.  Procedures Procedures (including critical care time)  Medications Ordered in ED Medications  potassium chloride SA (K-DUR,KLOR-CON) CR tablet 20 mEq (not administered)     Initial Impression / Assessment and Plan / ED Course  I have reviewed the triage vital signs and the nursing notes.  Pertinent labs & imaging results that were available during my care of the patient were reviewed by me and considered in my medical decision making (see chart for details).     Kaye Mitro is a 26 y.o. male with a past medical history significant for anxiety, depression, and anorexia recently discharged from this facility yesterday who presents under involuntary commitment taken out by mother for concern for danger to himself. According to IVC Dr. mentation, patient has been IVC 3 times in the last several weeks for concern of self-harm. Patient went to see his cardiologist today  who the patient reports recommended follow-up as needed and no other recommendations. Patient said that after returning home from a appointment, she was served his involuntary committed by Patent examinerlaw enforcement and brought here for evaluation. Patient denies suicidal ideation or homicidal ideation. He denies any audiovisual hallucinations at this time. He denies any physical complaints including no lightheadedness, palpations, chest pain, shortness breath, nausea, vomiting, lightheadedness, conservation, diarrhea, or dysuria.  Patient examined by me. Patient had no focal neurologic deficits. Lungs are clear. Abdomen and chest are nontender. Patient normal pulses in all extremities.  As patient was placed under IVC by his mother, patient will have a full evaluation including  lab testing, EKG, and TTS consult will be placed. Anticipate following up on recommendations of psychiatry team.  EKG appeared unchanged from prior. Diagnostic lab testing appeared similar to prior. Similar LFTs from prior. Slight decrease in potassium, oral supplementation will be ordered.  Given patient's previous medical clearance several days ago as well as his reassuring visit at the cardiologist office today, do not feel patient has any further medical management needs at this time. Patient continues to deny any physical complaints. Patient is medically cleared for further psychiatric management.    Final Clinical Impressions(s) / ED Diagnoses   Final diagnoses:  Involuntary commitment    Clinical Impression: 1. Involuntary commitment     Disposition: Awaiting Psych reevaluation in the AM    Tegeler, Canary Brimhristopher J, MD 06/21/16 1034

## 2016-06-20 NOTE — Progress Notes (Signed)
CSW spoke with pt's mother this pm and provided supportive listening as she expressed her concern for pt.  Pt's mother believes that pt is "slowly committing suicide" by starving himself and therefore, is a danger to himself.  Pt's mother is angry because pt has been cleared medically and psychiatrically several times by ED providers and if he is d/c'ed home after this visit, she states that she will get an attorney and the television media involved.  Pt's mother believes that a Child psychotherapistsocial worker should find patient [facility] as he can get funding from [facility] to pay for IP care.  Explained the process of psychiatric evaluation in the ED setting and validated her feelings regarding pt's health and well-being.  As CSW was speaking to pt's mother, pt got on the phone and adamantly stated that the medical providers should share no information with his mother.  CSW stated that his wishes would be documented in the chart and his patient rights would be respected by the medical team in this regard.  CSW shared with  pt re: his mother's concern for him and his health status.  Further explained that his mother was trying to advocate for him regarding his physical and psychiatric well-being.  Pt's only response was that we share no information with his mother without his consent.  CSW will continue to follow for disposition.  Pollyann SavoyJody Holiday Mcmenamin, LCSW Evening/ED Coverage [phone]

## 2016-06-20 NOTE — ED Notes (Signed)
Bed: WA27 Expected date:  Expected time:  Means of arrival:  Comments: [employer] IVC 

## 2016-06-20 NOTE — ED Triage Notes (Addendum)
Patient states he went to the Cardiologist today with his mother as directed. After returning home from doctor's appointment his mother was questioning him about his eating habits. Patient states his mother got upset and "IVC 'd me again".

## 2016-06-20 NOTE — Progress Notes (Signed)
     06/20/2016 [name]   01/15/1991  [id number]  Primary Physician Dessa PhiFunches, Josalyn, MD Primary Cardiologist: Runell GessJonathan J Raekwan Spelman MD Roseanne RenoFACP, FACC, FAHA, FSCAI  HPI:  Mr. Stanley Moon is a 26 year old thin/cachectic appearing single African American male with no children who currently does not work previous accompanied by his mother. He is recently hospitalized for anorexia and discharged. He is noted to be bradycardic. A 2-D echo was normal. He has no other cardiac risk factors. He denies numbness or syncope. I suspect his ACE inhibitor back bradycardia is related to his anorexia and poor nutrition.   Current Outpatient Prescriptions  Medication Sig Dispense Refill  . Multiple Vitamin (MULTIVITAMIN WITH MINERALS) TABS tablet Take 1 tablet by mouth daily. (Patient taking differently: Take 1 tablet by mouth at bedtime. ) 30 tablet -0  . sulfamethoxazole-trimethoprim (BACTRIM) 400-80 MG tablet Take 1 tablet by mouth 2 (two) times daily. 14 tablet 0   No current facility-administered medications for this visit.     Allergies  Allergen Reactions  . Peanuts [Peanut Oil] Anaphylaxis  . Lactose Intolerance (Gi) Nausea And Vomiting    Social History   Social History  . Marital status: Single    Spouse name: N/A  . Number of children: N/A  . Years of education: N/A   Occupational History  . Not on file.   Social History Main Topics  . Smoking status: Never Smoker  . Smokeless tobacco: Never Used  . Alcohol use No  . Drug use: No  . Sexual activity: Not Currently    Birth control/ protection: Condom     Comment: women    Other Topics Concern  . Not on file   Social History Narrative  . No narrative on file     Review of Systems: General: negative for chills, fever, night sweats or weight changes.  Cardiovascular: negative for chest pain, dyspnea on exertion, edema, orthopnea, palpitations, paroxysmal nocturnal dyspnea or shortness of breath Dermatological: negative for  rash Respiratory: negative for cough or wheezing Urologic: negative for hematuria Abdominal: negative for nausea, vomiting, diarrhea, bright red blood per rectum, melena, or hematemesis Neurologic: negative for visual changes, syncope, or dizziness All other systems reviewed and are otherwise negative except as noted above.    Blood pressure 109/71, pulse 78, height 5\' 10"  (1.778 m), weight 95 lb (43.1 kg), SpO2 100 %.  General appearance: alert and no distress Neck: no adenopathy, no carotid bruit, no JVD, supple, symmetrical, trachea midline and thyroid not enlarged, symmetric, no tenderness/mass/nodules Lungs: clear to auscultation bilaterally Heart: regular rate and rhythm, S1, S2 normal, no murmur, click, rub or gallop Extremities: extremities normal, atraumatic, no cyanosis or edema  EKG not performed today  ASSESSMENT AND PLAN:   Bradycardia Mr. Stanley Moon is a 26 year old thin and anorexic single African-American male referred by his PCP for bradycardia. He's had anorexia for several months. He was recently hospitalized. He has been involuntarily committed for this in the past. A 2-D echo was normal. His heart rate is in the high 30s to low 40s although he is completely symptomatic. I do not think he requires permanent transvenous pacing since there are no indications. I will see him back when necessary.      Runell GessJonathan J. Alex Leahy MD FACP,FACC,FAHA, [facility]SCAI 06/20/2016 10:47 AM

## 2016-06-20 NOTE — BH Assessment (Signed)
Tele Assessment Note   Stanley Moon is an 26 y.o. [facility] for [facility] by his mother. Pt stated "this is the third time I was here in the past 13 days". "I was just discharge on yesterday". Pt denies SI, HI and AVH at this time. No previous suicide attempt or self-injurious behaviors reported. No current mental health treatment reported. Pt denies psychiatric hospitalization; however [facility]. Pt is denying depressive symptoms and did not report any stressors other than being petitioned by his mother. Pt reported that he smokes marijuana several times during the week. No alcohol use reported. No trauma history reported and pt denies physical, sexual and emotional abuse. Pt shared that he lives with his grandmother. Pt reported that he is currently employed but reported that he is currently out on a leave of absence.  Collateral information gathered from pt's mother who reported that pt weighs 90lbs and stated "he is in starvation mode and not competent enough". Pt's mother reported htat she is going to special proceedings so that pt could be court ordered to treatment. Pt's mother also reported that [facility] have funding to help people get into an eating disorder clinic and the psych doctors are wrong when they clear him. She reported that she has a letter from pt's primary care physician stating that pt will need to remain in the hospital for 30 days. She also shared that she spoke to a doctor who reported that pt is medically cleared; however pt will make us (professionals) think he is okay; however his heart beat is 30-40 beats per minute. She also reported that pt's BMI is 13% and he is supposed to 130 lbs with a BMI of 18%. She reported that pt does not take his medication, will shower in the dark, completely isolated, edema, bulging eyes. She also reported that pt is dehydrated and need to be hooked up to  a feeding tube and given fluids. She reported that pt hasn't eaten for the past two days and is not planning to eat. She also reported that she need a doctor to sign off so that pt can get the help that he needs. She reported that Dr. Mckinley Jewelates and Dr. Armen PickupFunches recommended that pt be admitted to the hospital. She shared that last year patient wrote a suicide note and was admitted to the psychiatric hospital. She stated "every moment he doesn't get the help he need he is dying and stated multiple times that he need to be in a specialized unit.  Encouraged pt's mother to [facility] focused on eating disorders. She reported that [facility] and was informed that there is no residential program for adult males. She also reported that she has looked into other programs; [place].  Pt's mother provided this Clinical research associatewriter with a copy of pts's discharge recommendation from his last inpatient treatment as well as a hand written letter for the psychiatrist.  It is recommended that pt be evaluated by the psychiatrist for final disposition.     Diagnosis: Depression and anxiety   Past Medical History:  Past Medical History:  Diagnosis Date  . Anorexia   . Anxiety   . Asthma    as a child, "I haven't used inhalers in years"  . Bradycardia   . Depression   . Eczema   . Syphilis 08/29/2013    Past Surgical  History:  Procedure Laterality Date  . EXTERNAL EAR SURGERY      Family History:  Family History  Problem Relation Age of Onset  . Hypertension Maternal Grandmother     Social History:  reports that he has never smoked. He has never used smokeless tobacco. He reports that he does not drink alcohol or use drugs.  Additional Social History:  Alcohol / Drug Use Pain Medications: Denies use Prescriptions: Denies use Over the Counter: Denies abuse History of alcohol / drug use?: Yes Longest period of sobriety (when/how long):  NA Substance #1 Name of Substance 1: THC 1 - Age of First Use: 15 1 - Amount (size/oz): varies 1 - Frequency: "almost everyday" 1 - Duration: ongoing  1 - Last Use / Amount: 06-20-16  CIWA: CIWA-Ar BP: 108/67 Pulse Rate: (!) 57 COWS:    PATIENT STRENGTHS: (choose at least two) Average or above average intelligence Supportive family/friends  Allergies:  Allergies  Allergen Reactions  . Peanuts [Peanut Oil] Anaphylaxis  . Lactose Intolerance (Gi) Nausea And Vomiting    Home Medications:  (Not in a hospital admission)  OB/GYN Status:  No LMP for male patient.  General Assessment Data Location of Assessment: [facility] ED TTS Assessment: In system Is this a Tele or Face-to-Face Assessment?: Face-to-Face Is this an Initial Assessment or a Re-assessment for this encounter?: Initial Assessment Marital status: Single Living Arrangements: Other relatives (grandmother) Can pt return to current living arrangement?: Yes Admission Status: Involuntary Is patient capable of signing voluntary admission?: Yes Referral Source: Self/Family/Friend Insurance type: [facility] Discount      Crisis Care Plan Living Arrangements: Other relatives (grandmother) Name of Psychiatrist: No provider reported  Name of Therapist: No provider reported.   Education Status Is patient currently in school?: No Highest grade of school patient has completed: 12  Risk to self with the past 6 months Suicidal Ideation: No Has patient been a risk to self within the past 6 months prior to admission? : No Suicidal Intent: No Has patient had any suicidal intent within the past 6 months prior to admission? : No Is patient at risk for suicide?: No Suicidal Plan?: No Has patient had any suicidal plan within the past 6 months prior to admission? : No Access to Means: No What has been your use of drugs/alcohol within the last 12 months?: Daily marijuana use reported.  Previous Attempts/Gestures: No How many times?:  0 Other Self Harm Risks: Pt denies.  Triggers for Past Attempts: Other (Comment) (no past attempts or gestures reported) Intentional Self Injurious Behavior: None Family Suicide History: Unknown Recent stressful life event(s):  ("being here") Persecutory voices/beliefs?: No Depression: No Depression Symptoms:  (Pt denies. ) Substance abuse history and/or treatment for substance abuse?: No Suicide prevention information given to non-admitted patients: Not applicable  Risk to Others within the past 6 months Homicidal Ideation: No Does patient have any lifetime risk of violence toward others beyond the six months prior to admission? : No Thoughts of Harm to Others: No Current Homicidal Intent: No Current Homicidal Plan: No Access to Homicidal Means: No Identified Victim: N/A History of harm to others?: No Assessment of Violence: None Noted Violent Behavior Description: No violent behaviors observed. Pt is calm and cooperative at this time.  Does patient have access to weapons?: No Criminal Charges Pending?: No Does patient have a court date: No Is patient on probation?: No  Psychosis Hallucinations: None noted Delusions: None noted  Mental Status Report Appearance/Hygiene: In scrubs Eye Contact: Fair  Motor Activity: Freedom of movement Speech: Logical/coherent Level of Consciousness: Alert Mood: Euthymic Affect: Appropriate to circumstance Anxiety Level: Minimal Thought Processes: Coherent, Relevant Judgement: Partial Orientation: Time, Situation, Place, Person Obsessive Compulsive Thoughts/Behaviors: None  Cognitive Functioning Concentration: Decreased Memory: Remote Intact, Recent Intact IQ: Average Insight: Good Impulse Control: Fair Appetite: Poor Weight Loss:  ("It fluctuates". ) Weight Gain:  ("It fluctuates" ) Sleep: No Change Total Hours of Sleep: 6 Vegetative Symptoms: None  ADLScreening [facility] Assessment Services) Patient's cognitive ability adequate  to safely complete daily activities?: Yes Patient able to express need for assistance with ADLs?: Yes Independently performs ADLs?: Yes (appropriate for developmental age)  Prior Inpatient Therapy Prior Inpatient Therapy: Yes Prior Therapy Dates: October 2017 Prior Therapy Facilty/Provider(s): [facility] Reason for Treatment: depression and anorexia   Prior Outpatient Therapy Prior Outpatient Therapy: Yes Prior Therapy Dates: Jan. 2018 per previous assessment  Prior Therapy Facilty/Provider(s): [facility]  Reason for Treatment: medication management  Does patient have an ACCT team?: No Does patient have Intensive In-House Services?  : No Does patient have [facility] services? : No Does patient have P4CC services?: No  ADL Screening (condition at time of admission) Patient's cognitive ability adequate to safely complete daily activities?: Yes Is the patient deaf or have difficulty hearing?: No Does the patient have difficulty seeing, even when wearing glasses/contacts?: No Does the patient have difficulty concentrating, remembering, or making decisions?: No Patient able to express need for assistance with ADLs?: Yes Does the patient have difficulty dressing or bathing?: No Independently performs ADLs?: Yes (appropriate for developmental age) Does the patient have difficulty walking or climbing stairs?: No       Abuse/Neglect Assessment (Assessment to be complete while patient is alone) Physical Abuse: Denies Verbal Abuse: Denies Sexual Abuse: Denies Exploitation of patient/patient's resources: Denies Self-Neglect: Denies Values / Beliefs Cultural Requests During Hospitalization: None Spiritual Requests During Hospitalization: None   Advance Directives (For Healthcare) Does Patient Have a Medical Advance Directive?: No Would patient like information on creating a medical advance directive?: No - Patient declined    Additional Information 1:1 In Past 12 Months?: Yes CIRT Risk:  No Elopement Risk: No Does patient have medical clearance?: Yes     Disposition:  Disposition Initial Assessment Completed for this Encounter: Yes  Elior Robinette S 06/20/2016 9:04 PM

## 2016-06-20 NOTE — ED Notes (Signed)
Received call from Registration, Sunny SchleinFelicia, stating mother is requesting to speak to Stanley Moon.  Lequesta informed & is accommodating request.

## 2016-06-20 NOTE — BH Assessment (Signed)
Assessment completed. Consulted Nira ConnJason Berry, FNP who recommended that pt be evaluated by the psychiatrist for final disposition. Informed Dr. Rush Landmarkegeler of the recommendation.

## 2016-06-20 NOTE — Telephone Encounter (Signed)
Pt's mom was called and informed of letter and paperwork being ready for pick up.

## 2016-06-20 NOTE — Assessment & Plan Note (Signed)
Stanley Moon is a 26 year old thin and anorexic single African-American male referred by his PCP for bradycardia. He's had anorexia for several months. He was recently hospitalized. He has been involuntarily committed for this in the past. A 2-D echo was normal. His heart rate is in the high 30s to low 40s although he is completely symptomatic. I do not think he requires permanent transvenous pacing since there are no indications. I will see him back when necessary.

## 2016-06-20 NOTE — Patient Instructions (Signed)
Your physician recommends that you schedule a follow-up appointment as needed with Dr. Berry   

## 2016-06-21 DIAGNOSIS — F5000 Anorexia nervosa, unspecified: Secondary | ICD-10-CM

## 2016-06-21 MED ORDER — POTASSIUM CHLORIDE CRYS ER 20 MEQ PO TBCR
20.0000 meq | EXTENDED_RELEASE_TABLET | Freq: Once | ORAL | Status: DC
  Filled 2016-06-21: qty 1

## 2016-06-21 MED ORDER — ENSURE ENLIVE PO LIQD
237.0000 mL | Freq: Three times a day (TID) | ORAL | Status: DC
  Administered 2016-06-21: 237 mL via ORAL
  Filled 2016-06-21 (×2): qty 237

## 2016-06-21 MED ORDER — ENSURE ENLIVE PO LIQD: 237.0000 mL | Freq: Three times a day (TID) | ORAL | 90 refills | Status: AC

## 2016-06-21 MED ORDER — POTASSIUM CHLORIDE 20 MEQ/15ML (10%) PO SOLN
20.0000 meq | Freq: Once | ORAL | Status: AC
  Administered 2016-06-21: 20 meq via ORAL
  Filled 2016-06-21: qty 15

## 2016-06-21 NOTE — Progress Notes (Signed)
CSW spoke with patient at bedside, patient reported that he was provided with resources from his previous social worker Joni Reiningicole and that he has not had the opportunity to follow up because he has been placed in the hospital multiple times. Patient reported that he wants to follow up on his own and requested bus pass for transportation. CSW inquired if patient needed any additional resources, patient reported that he still has his resources from his previous visit. CSW provided bus pass to patient's RN.  Celso SickleKimberly Case Vassell, LCSWA [facility]  Clinical Social Worker [phone]

## 2016-06-21 NOTE — ED Provider Notes (Signed)
10:46 AM Patient awake and alert.  He acknowledges that he has additional work to do for his anorexia, demonstrate some insight into his condition, also acknowledges some frustration at his repeated IVC papers being treated by his mother, stating that he is having difficulty following up with outpatient providers due to his hospitalizations. Secondary medical clearance was requested by the health health staff, as the patient is having IVC papers rescinded by psychiatry. Patient does have minimal hypokalemia, but during my discussion with him received a potassium supplement as well as nutrition shake. With otherwise reassuring labs, no medical complaints, patient is medically clear.   Gerhard MunchLockwood, Ashad Fawbush, MD 06/21/16 1049

## 2016-06-21 NOTE — ED Notes (Signed)
Pt discharged home. Discharged instructions read to pt who verbalized understanding. All belongings returned to pt who signed for same. Denies SI/HI, is not delusional and not responding to internal stimuli. Escorted pt to the ED exit.  Bus ticket given.

## 2016-06-21 NOTE — ED Notes (Signed)
Introduced self to patient. Pt oriented to unit expectations.  Assessed pt for:  A) Anxiety &/or agitation: Pt is calm, cooperative and polite. He does not report any anxiety or distress. He does not appear to be depressed. He is articulate and able to express his needs. Denies SI/HI/AVH. He does not appear to be responding to internal stimulation. He took his potassium in liquid form and drank 100% of Ensure given to him. He states that he has a problem with some foods because of "gastritis" and is very selective about what he eats.   S) Safety: Safety maintained with q-15-minute checks and hourly rounds by staff.  A) ADLs: Pt able to perform ADLs independently.  P) Pick-Up (room cleanliness): Pt's room clean and free of clutter.

## 2016-06-21 NOTE — ED Notes (Signed)
Pt's labs and vitals were monitored throughout the night. Pt supported emotionally and encouraged to express concerns and questions. Pt educated on medications.Pt's safety ensured with 15 minute and environmental checks. Pt currently denies SI/HI/Self Harm and AVH. Pt verbally contracts to seek staff if SI/HI or A/VH occurs and to consult with staff before acting on any harmful thoughts. Will continue to monitor.

## 2016-06-21 NOTE — ED Notes (Signed)
Pt did not eat breakfast. He refused offers of cereal or something else to eat. States that he will drink Ensure. Does not want to be here.

## 2016-06-21 NOTE — [facility] Suicide Risk Assessment (Signed)
Suicide Risk Assessment  Discharge Assessment   [facility]   Principal Problem: <principal problem not specified> Discharge Diagnoses:  Patient Active Problem List   Diagnosis Date Noted  . 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 Protein-calorie malnutrition, severe [E43] 06/11/2016  . Marijuana use [F12.90] 06/11/2016  . Hypokalemia [E87.6] 06/11/2016  . Hypotension [I95.9] 06/11/2016  . Lower extremity edema [R60.0] 06/11/2016  . Depression [F32.9] 06/11/2016  . Furuncle of chest wall [L02.223] 06/10/2016  . Bradycardia [R00.1] 06/10/2016  . Elevated LFTs [R79.89] 06/10/2016  . Gastritis [K29.70] 06/10/2016  . Underweight [R63.6] 12/21/2015  . Anorexia nervosa [F50.00] 11/05/2015    Total Time spent with patient: 45 minutes  Musculoskeletal: Strength & Muscle Tone: within normal limits Gait & Station: normal Patient leans: N/A  Psychiatric Specialty Exam:   Blood pressure 100/61, pulse (!) 56, temperature 98 F (36.7 C), temperature source Oral, resp. rate 15, SpO2 100 %.There is no height or weight on file to calculate BMI.  General Appearance: Casual  Eye Contact::  Good  Speech:  Normal Rate409  Volume:  Normal  Mood:  Depressed, mild  Affect:  Congruent  Thought Process:  Coherent and Descriptions of Associations: Intact  Orientation:  Full (Time, Place, and Person)  Thought Content:  WDL and Logical  Suicidal Thoughts:  No  Homicidal Thoughts:  No  Memory:  Immediate;   Good Recent;   Good Remote;   Good  Judgement:  Good  Insight:  Fair  Psychomotor Activity:  Normal  Concentration:  Good  Recall:  Good  Fund of Knowledge:Good  Language: Good  Akathisia:  No  Handed:  Right  AIMS (if indicated):     Assets:  Housing Leisure Time Physical Health Resilience Social Support  Sleep:     Cognition: WNL  ADL's:  Intact   Mental Status Per Nursing Assessment::   On Admission:      Demographic Factors:  Male and Adolescent or young adult  Loss Factors: NA  Historical Factors: NA  Risk Reduction Factors:   Sense of responsibility to family, Living with another person, especially a relative, Positive social support and Positive therapeutic relationship  Continued Clinical Symptoms:  Depression, mild  Cognitive Features That Contribute To Risk:  None    Suicide Risk:  Minimal: No identifiable suicidal ideation.  Patients presenting with no risk factors but with morbid ruminations; may be classified as minimal risk based on the severity of the depressive symptoms    Plan Of Care/Follow-up recommendations:  Activity:  as tolerated Diet:  heart healhty diet  LORD, JAMISON, NP 06/21/2016, 11:34 AM

## 2016-06-21 NOTE — Progress Notes (Signed)
CSW filed patient's examination and recommendation paperwork into IVC logbook.  Kristin Barcus, LCSWA [facility]  Clinical Social Worker [phone] 

## 2016-06-21 NOTE — Consult Note (Signed)
[facility] Psychiatry Consult   Reason for Consult:  Psychiatry evaluation Referring Physician:  EDP Stanley Moon Identification: Stanley Moon MRN:  [id number] Principal Diagnosis: Anorexia nervosa Diagnosis:   Stanley Moon Active Problem List   Diagnosis Date Noted  . Anorexia nervosa [F50.00] 11/05/2015    Priority: Medium  . Depression, major, recurrent, mild ([place]) [F33.0] 06/18/2016  . Protein-calorie malnutrition, severe [E43] 06/11/2016  . Marijuana use [F12.90] 06/11/2016  . Hypokalemia [E87.6] 06/11/2016  . Hypotension [I95.9] 06/11/2016  . Lower extremity edema [R60.0] 06/11/2016  . Depression [F32.9] 06/11/2016  . Furuncle of chest wall [L02.223] 06/10/2016  . Bradycardia [R00.1] 06/10/2016  . Elevated LFTs [R79.89] 06/10/2016  . Gastritis [K29.70] 06/10/2016  . Underweight [R63.6] 12/21/2015    Total Time spent with Stanley Moon: 45 minutes  Subjective:   Stanley Moon is a 26 y.o. male Stanley Moon admitted under commitment paper brought out by his mother after she was upset that Stanley Moon was not eating regularly.  HPI:  Stanley Moon reports history of Eating disorder, depression, anxiety and cannabis abuse. He was brought to [facility] under IVC'd taking out by his who believes that Stanley Moon has not been eating enough food and in danger as a result. I spoke to Stanley Moon's mother  who believes that I have the power to force feed her son. Mother was informed that I cannot forced feed the Stanley Moon but will give Stanley Moon resources for outpatient/inpatient treatment at eating disorder facility. Stanley Moon was evaluated by ED physician twice and medically cleared after he was interviewed and his lab work reviewed. Stanley Moon has insight into his problem, he acknowledged. Stanley Moon denies psychosis, SI/HI, depression and anxiety but states that he is upset that his mother keep committing him to emergency room which has prevented him from seeking a proper help from eating disorder clinic that he was referred to by our  social workers. Stanley Moon denies prior history of suicide and  alcohol use disorder but smokes Cannabis regularly to stimulate his appetite. He verbalizes that he is own guardian and does not know why his mother has so much power to keep putting him in the hospital against his wish.  Past Psychiatric History: as above  Risk to Self: Suicidal Ideation: No Suicidal Intent: No Is Stanley Moon at risk for suicide?: No Suicidal Plan?: No Access to Means: No What has been your use of drugs/alcohol within the last 12 months?: Daily marijuana use reported.  How many times?: 0 Other Self Harm Risks: Pt denies.  Triggers for Past Attempts: Other (Comment) (no past attempts or gestures reported) Intentional Self Injurious Behavior: None Risk to Others: Homicidal Ideation: No Thoughts of Harm to Others: No Current Homicidal Intent: No Current Homicidal Plan: No Access to Homicidal Means: No Identified Victim: N/A History of harm to others?: No Assessment of Violence: None Noted Violent Behavior Description: No violent behaviors observed. Pt is calm and cooperative at this time.  Does Stanley Moon have access to weapons?: No Criminal Charges Pending?: No Does Stanley Moon have a court date: No Prior Inpatient Therapy: Prior Inpatient Therapy: Yes Prior Therapy Dates: October 2017 Prior Therapy Facilty/Provider(s): [facility] Reason for Treatment: depression and anorexia  Prior Outpatient Therapy: Prior Outpatient Therapy: Yes Prior Therapy Dates: Jan. 2018 per previous assessment  Prior Therapy Facilty/Provider(s): [facility]  Reason for Treatment: medication management  Does Stanley Moon have an ACCT team?: No Does Stanley Moon have Intensive In-House Services?  : No Does Stanley Moon have [facility] services? : No Does Stanley Moon have P4CC services?: No  Past Medical History:  Past Medical History:  Diagnosis Date  . Anorexia   . Anxiety   . Asthma    as a child, "I haven't used inhalers in years"  . Bradycardia   .  Depression   . Eczema   . Syphilis 08/29/2013    Past Surgical History:  Procedure Laterality Date  . EXTERNAL EAR SURGERY     Family History:  Family History  Problem Relation Age of Onset  . Hypertension Maternal Grandmother    Family Psychiatric  History: as above Social History:  History  Alcohol Use No     History  Drug Use No    Social History   Social History  . Marital status: Single    Spouse name: N/A  . Number of children: N/A  . Years of education: N/A   Social History Main Topics  . Smoking status: Never Smoker  . Smokeless tobacco: Never Used  . Alcohol use No  . Drug use: No  . Sexual activity: Not Currently    Birth control/ protection: Condom     Comment: women    Other Topics Concern  . None   Social History Narrative  . None   Additional Social History:    Allergies:   Allergies  Allergen Reactions  . Peanuts [Peanut Oil] Anaphylaxis  . Lactose Intolerance (Gi) Nausea And Vomiting    Labs:  Results for orders placed or performed during the hospital encounter of 06/20/16 (from the past 48 hour(s))  Comprehensive metabolic panel     Status: Abnormal   Collection Time: 06/20/16  6:06 PM  Result Value Ref Range   Sodium 135 135 - 145 mmol/L   Potassium 3.3 (L) 3.5 - 5.1 mmol/L   Chloride 95 (L) 101 - 111 mmol/L   CO2 32 22 - 32 mmol/L   Glucose, Bld 117 (H) 65 - 99 mg/dL   BUN 20 6 - 20 mg/dL   Creatinine, Ser 1.23 0.61 - 1.24 mg/dL   Calcium 9.4 8.9 - 10.3 mg/dL   Total Protein 7.6 6.5 - 8.1 g/dL   Albumin 4.7 3.5 - 5.0 g/dL   AST 104 (H) 15 - 41 U/L   ALT 214 (H) 17 - 63 U/L   Alkaline Phosphatase 62 38 - 126 U/L   Total Bilirubin 0.4 0.3 - 1.2 mg/dL   GFR calc non Af Amer >60 >60 mL/min   GFR calc Af Amer >60 >60 mL/min    Comment: (NOTE) The eGFR has been calculated using the CKD EPI equation. This calculation has not been validated in all clinical situations. eGFR's persistently <60 mL/min signify possible Chronic  Kidney Disease.    Anion gap 8 5 - 15  Ethanol     Status: None   Collection Time: 06/20/16  6:06 PM  Result Value Ref Range   Alcohol, Ethyl (B) <5 <5 mg/dL    Comment:        LOWEST DETECTABLE LIMIT FOR SERUM ALCOHOL IS 5 mg/dL FOR MEDICAL PURPOSES ONLY   Salicylate level     Status: None   Collection Time: 06/20/16  6:06 PM  Result Value Ref Range   Salicylate Lvl <5.8 2.8 - 30.0 mg/dL  Acetaminophen level     Status: Abnormal   Collection Time: 06/20/16  6:06 PM  Result Value Ref Range   Acetaminophen (Tylenol), Serum <10 (L) 10 - 30 ug/mL    Comment:        THERAPEUTIC CONCENTRATIONS VARY SIGNIFICANTLY. A RANGE OF 10-30 ug/mL MAY BE AN EFFECTIVE  CONCENTRATION FOR MANY PATIENTS. HOWEVER, SOME ARE BEST TREATED AT CONCENTRATIONS OUTSIDE THIS RANGE. ACETAMINOPHEN CONCENTRATIONS >150 ug/mL AT 4 HOURS AFTER INGESTION AND >50 ug/mL AT 12 HOURS AFTER INGESTION ARE OFTEN ASSOCIATED WITH TOXIC REACTIONS.   cbc     Status: Abnormal   Collection Time: 06/20/16  6:06 PM  Result Value Ref Range   WBC 3.7 (L) 4.0 - 10.5 K/uL   RBC 4.78 4.22 - 5.81 MIL/uL   Hemoglobin 14.3 13.0 - 17.0 g/dL   HCT 40.6 39.0 - 52.0 %   MCV 84.9 78.0 - 100.0 fL   MCH 29.9 26.0 - 34.0 pg   MCHC 35.2 30.0 - 36.0 g/dL   RDW 13.3 11.5 - 15.5 %   Platelets 182 150 - 400 K/uL  Rapid urine drug screen (hospital performed)     Status: Abnormal   Collection Time: 06/20/16  6:17 PM  Result Value Ref Range   Opiates NONE DETECTED NONE DETECTED   Cocaine NONE DETECTED NONE DETECTED   Benzodiazepines NONE DETECTED NONE DETECTED   Amphetamines NONE DETECTED NONE DETECTED   Tetrahydrocannabinol POSITIVE (A) NONE DETECTED   Barbiturates NONE DETECTED NONE DETECTED    Comment:        DRUG SCREEN FOR MEDICAL PURPOSES ONLY.  IF CONFIRMATION IS NEEDED FOR ANY PURPOSE, NOTIFY LAB WITHIN 5 DAYS.        LOWEST DETECTABLE LIMITS FOR URINE DRUG SCREEN Drug Class       Cutoff (ng/mL) Amphetamine       1000 Barbiturate      200 Benzodiazepine   678 Tricyclics       938 Opiates          300 Cocaine          300 THC              50     Current Facility-Administered Medications  Medication Dose Route Frequency Provider Last Rate Last Dose  . feeding supplement (ENSURE ENLIVE) (ENSURE ENLIVE) liquid 237 mL  237 mL Oral TID BM Patrecia Pour, NP   237 mL at 06/21/16 1029   Current Outpatient Prescriptions  Medication Sig Dispense Refill  . Multiple Vitamin (MULTIVITAMIN WITH MINERALS) TABS tablet Take 1 tablet by mouth daily. (Stanley Moon taking differently: Take 1 tablet by mouth at bedtime. ) 30 tablet -0  . sulfamethoxazole-trimethoprim (BACTRIM) 400-80 MG tablet Take 1 tablet by mouth 2 (two) times daily. 14 tablet 0  . feeding supplement, ENSURE ENLIVE, (ENSURE ENLIVE) LIQD Take 237 mLs by mouth 3 (three) times daily between meals. 237 mL 90    Musculoskeletal: Strength & Muscle Tone: within normal limits Gait & Station: normal Stanley Moon leans: N/A  Psychiatric Specialty Exam: Physical Exam  Psychiatric: He has a normal mood and affect. His speech is normal and behavior is normal. Judgment and thought content normal. Cognition and memory are normal.    Review of Systems  Constitutional: Negative.   HENT: Negative.   Eyes: Negative.   Respiratory: Negative.   Cardiovascular: Negative.   Gastrointestinal: Negative.   Genitourinary: Negative.   Musculoskeletal: Negative.   Skin: Negative.   Neurological: Negative.   Endo/Heme/Allergies: Negative.   Psychiatric/Behavioral: Negative.     Blood pressure 100/61, pulse (!) 56, temperature 98 F (36.7 C), temperature source Oral, resp. rate 15, SpO2 100 %.There is no height or weight on file to calculate BMI.  General Appearance: Casual  Eye Contact:  Good  Speech:  Clear and Coherent  Volume:  Normal  Mood:  Euthymic  Affect:  Appropriate  Thought Process:  Coherent and Descriptions of Associations: Intact  Orientation:   Full (Time, Place, and Person)  Thought Content:  Logical  Suicidal Thoughts:  No  Homicidal Thoughts:  No  Memory:  Immediate;   Fair Recent;   Fair Remote;   Fair  Judgement:  Fair  Insight:  Fair  Psychomotor Activity:  Normal  Concentration:  Concentration: Fair and Attention Span: Fair  Recall:  Good  Fund of Knowledge:  Good  Language:  Good  Akathisia:  No  Handed:  Right  AIMS (if indicated):     Assets:  Communication Skills Desire for Improvement  ADL's:  Intact  Cognition:  WNL  Sleep:   fair     Treatment Plan Summary: Plan:  -Stanley Moon is cleared by psychiatric service. - He is referred to social service to get resources for outpatient/inpatient management at Eating disorder facility.  -Continue Ensure three time daily -Continue  Remeron 7.5 mg daily at qhs for depression  Disposition: No evidence of imminent risk to self or others at present.   Stanley Moon does not meet criteria for psychiatric inpatient admission. Supportive therapy provided about ongoing stressors.                        Corena Pilgrim, MD 06/21/2016 11:47 AM

## 2016-07-01 ENCOUNTER — Ambulatory Visit: Payer: Self-pay | Attending: Family Medicine | Admitting: Family Medicine

## 2016-07-01 ENCOUNTER — Encounter: Payer: Self-pay | Admitting: Family Medicine

## 2016-07-01 VITALS — BP 104/65 | HR 60 | Temp 98.3°F | Wt 105.0 lb

## 2016-07-01 DIAGNOSIS — R001 Bradycardia, unspecified: Secondary | ICD-10-CM | POA: Insufficient documentation

## 2016-07-01 DIAGNOSIS — Z79899 Other long term (current) drug therapy: Secondary | ICD-10-CM | POA: Insufficient documentation

## 2016-07-01 DIAGNOSIS — F329 Major depressive disorder, single episode, unspecified: Secondary | ICD-10-CM

## 2016-07-01 DIAGNOSIS — R636 Underweight: Secondary | ICD-10-CM | POA: Insufficient documentation

## 2016-07-01 DIAGNOSIS — I959 Hypotension, unspecified: Secondary | ICD-10-CM | POA: Insufficient documentation

## 2016-07-01 DIAGNOSIS — K219 Gastro-esophageal reflux disease without esophagitis: Secondary | ICD-10-CM | POA: Insufficient documentation

## 2016-07-01 DIAGNOSIS — E876 Hypokalemia: Secondary | ICD-10-CM | POA: Insufficient documentation

## 2016-07-01 DIAGNOSIS — F5000 Anorexia nervosa, unspecified: Secondary | ICD-10-CM

## 2016-07-01 DIAGNOSIS — F32A Depression, unspecified: Secondary | ICD-10-CM

## 2016-07-01 NOTE — Patient Instructions (Addendum)
Stanley Moon was seen today for hospitalization follow-up.  Diagnoses and all orders for this visit:  Underweight -     CMP14+EGFR -     Amb ref to Medical Nutrition Therapy-MNT -     Prealbumin   F/u in 3-4 weeks for weight check   Dr. Adrian Blackwater

## 2016-07-01 NOTE — Progress Notes (Signed)
Subjective:  Patient ID: Stanley Moon, male    DOB: Oct 26, 1990  Age: 26 y.o. MRN: [id number]  CC: Hospitalization Follow-up   HPI Stanley Moon presents for    1. Hospitalization follow up: he was hospitalized from 5/8-5/10/18 at [facility] for anorexia, weight loss, edema, hypotension and bradycardia. He has a normal ECHO. Normal TFTs. His hypotension improved with IVFs. His hypokalemia and low albumin improved with po intake. He UDS was postiive for [facility] and counseling was provided.   He reports he is taking a multivitamin and an Equate a day. He reports stopping exercise. He has not returned to work and is not walking as much as he previously did. He denies depression, anxiety, restrictive diet. He reports that his weight loss was due to low income, unstable housing and GERD. He is now living with his grandmother and has unrestricted access to food.  He declines PPI or H2 blocker for GERD.  Social History  Substance Use Topics  . Smoking status: Never Smoker  . Smokeless tobacco: Never Used  . Alcohol use No    Outpatient Medications Prior to Visit  Medication Sig Dispense Refill  . feeding supplement, ENSURE ENLIVE, (ENSURE ENLIVE) LIQD Take 237 mLs by mouth 3 (three) times daily between meals. 237 mL 90  . Multiple Vitamin (MULTIVITAMIN WITH MINERALS) TABS tablet Take 1 tablet by mouth daily. (Patient taking differently: Take 1 tablet by mouth at bedtime. ) 30 tablet -0  . sulfamethoxazole-trimethoprim (BACTRIM) 400-80 MG tablet Take 1 tablet by mouth 2 (two) times daily. (Patient not taking: Reported on 07/01/2016) 14 tablet 0   No facility-administered medications prior to visit.     ROS Review of Systems  Constitutional: Negative for chills, fatigue, fever and unexpected weight change.  Eyes: Negative for visual disturbance.  Respiratory: Negative for cough and shortness of breath.   Cardiovascular: Negative for chest pain, palpitations and leg swelling.    Gastrointestinal: Negative for abdominal pain, blood in stool, constipation, diarrhea, nausea and vomiting.  Endocrine: Negative for polydipsia, polyphagia and polyuria.  Musculoskeletal: Negative for arthralgias, back pain, gait problem, myalgias and neck pain.  Skin: Negative for rash.  Allergic/Immunologic: Negative for immunocompromised state.  Hematological: Negative for adenopathy. Does not bruise/bleed easily.  Psychiatric/Behavioral: Negative for dysphoric mood, sleep disturbance and suicidal ideas. The patient is not nervous/anxious.     Objective:  BP 104/65   Pulse 60   Temp 98.3 F (36.8 C) (Oral)   Wt 105 lb (47.6 kg)   SpO2 100%   BMI 15.07 kg/m   BP/Weight 07/01/2016 06/21/2016 5/57/3220  Systolic BP 254 270 623  Diastolic BP 65 54 71  Wt. (Lbs) 105 - 95  BMI 15.07 - 13.63  Some encounter information is confidential and restricted. Go to Review Flowsheets activity to see all data.   Wt Readings from Last 3 Encounters:  07/01/16 105 lb (47.6 kg)  06/20/16 95 lb (43.1 kg)  06/12/16 103 lb 2.8 oz (46.8 kg)    Physical Exam  Constitutional: He appears well-developed. He appears cachectic. No distress.  HENT:  Head: Normocephalic and atraumatic.  Neck: Normal range of motion. Neck supple.  Cardiovascular: Normal rate, regular rhythm, normal heart sounds and intact distal pulses.   Pulmonary/Chest: Effort normal and breath sounds normal.  Musculoskeletal: He exhibits edema (in b/l ankles).  Neurological: He is alert.  Skin: Skin is warm and dry. No rash noted. No erythema.  Psychiatric: He has a normal mood and affect.  Assessment & Plan:  Stanley Moon was seen today for hospitalization follow-up.  Diagnoses and all orders for this visit:  Underweight -     CMP14+EGFR -     Amb ref to Medical Nutrition Therapy-MNT -     Prealbumin   There are no diagnoses linked to this encounter.  No orders of the defined types were placed in this  encounter.   Follow-up: Return in about 4 weeks (around 07/29/2016) for weight check.   Boykin Nearing MD

## 2016-07-02 LAB — CMP14+EGFR
A/G RATIO: 2.2 (ref 1.2–2.2)
ALBUMIN: 4.1 g/dL (ref 3.5–5.5)
ALK PHOS: 42 IU/L (ref 39–117)
ALT: 181 IU/L — ABNORMAL HIGH (ref 0–44)
AST: 77 IU/L — ABNORMAL HIGH (ref 0–40)
BUN/Creatinine Ratio: 14 (ref 9–20)
BUN: 11 mg/dL (ref 6–20)
CHLORIDE: 98 mmol/L (ref 96–106)
CO2: 28 mmol/L (ref 18–29)
Calcium: 8.9 mg/dL (ref 8.7–10.2)
Creatinine, Ser: 0.8 mg/dL (ref 0.76–1.27)
GFR calc non Af Amer: 125 mL/min/{1.73_m2} (ref >59)
GFR, EST AFRICAN AMERICAN: 145 mL/min/{1.73_m2} (ref >59)
GLUCOSE: 96 mg/dL (ref 65–99)
Globulin, Total: 1.9 g/dL (ref 1.5–4.5)
Potassium: 3.8 mmol/L (ref 3.5–5.2)
Sodium: 139 mmol/L (ref 134–144)
TOTAL PROTEIN: 6 g/dL (ref 6.0–8.5)

## 2016-07-02 LAB — PREALBUMIN: PREALBUMIN: 29 mg/dL (ref 14–35)

## 2016-07-03 NOTE — Assessment & Plan Note (Signed)
Patient denies depression. 

## 2016-07-03 NOTE — Assessment & Plan Note (Signed)
Patient denies anorexia He reports he is not exercising or restricting his diet

## 2016-07-03 NOTE — Assessment & Plan Note (Signed)
Patient is underweight. He denies excessive exercise and calorie restriction  Plan: CMP and prealbumin Referral to nutitionist

## 2016-07-07 ENCOUNTER — Telehealth: Payer: Self-pay | Admitting: Family Medicine

## 2016-07-07 ENCOUNTER — Ambulatory Visit: Payer: Self-pay

## 2016-07-07 NOTE — Progress Notes (Signed)
Pt was informed of lab results in office on 07/07/16

## 2016-07-07 NOTE — Telephone Encounter (Signed)
Pt came in requesting another referral to dental. States that the last time he called they told him the referral was not in the system. Please f/u. Thank you.

## 2016-07-07 NOTE — Telephone Encounter (Signed)
Patient referral is in place He is on waiting list

## 2016-07-14 ENCOUNTER — Ambulatory Visit: Payer: Self-pay | Attending: Family Medicine

## 2016-08-04 ENCOUNTER — Ambulatory Visit: Payer: Self-pay | Admitting: Family Medicine

## 2016-08-14 ENCOUNTER — Ambulatory Visit: Payer: Self-pay | Admitting: Family Medicine

## 2016-08-14 NOTE — Progress Notes (Deleted)
   Subjective:  Patient ID: Stanley Moon, male    DOB: 03/09/1990  Age: 26 y.o. MRN: [id number]  CC: No chief complaint on file.   HPI Stanley SpryQunzae Elwood has anorexia, GERD, depression he  presents for    1. Anorexia:    Social History  Substance Use Topics  . Smoking status: Never Smoker  . Smokeless tobacco: Never Used  . Alcohol use No    Outpatient Medications Prior to Visit  Medication Sig Dispense Refill  . feeding supplement, ENSURE ENLIVE, (ENSURE ENLIVE) LIQD Take 237 mLs by mouth 3 (three) times daily between meals. 237 mL 90  . Multiple Vitamin (MULTIVITAMIN WITH MINERALS) TABS tablet Take 1 tablet by mouth daily. (Patient taking differently: Take 1 tablet by mouth at bedtime. ) 30 tablet -0   No facility-administered medications prior to visit.     ROS Review of Systems  Constitutional: Negative for chills, fatigue, fever and unexpected weight change.  Eyes: Negative for visual disturbance.  Respiratory: Negative for cough and shortness of breath.   Cardiovascular: Negative for chest pain, palpitations and leg swelling.  Gastrointestinal: Negative for abdominal pain, blood in stool, constipation, diarrhea, nausea and vomiting.  Endocrine: Negative for polydipsia, polyphagia and polyuria.  Musculoskeletal: Negative for arthralgias, back pain, gait problem, myalgias and neck pain.  Skin: Negative for rash.  Allergic/Immunologic: Negative for immunocompromised state.  Hematological: Negative for adenopathy. Does not bruise/bleed easily.  Psychiatric/Behavioral: Negative for dysphoric mood, sleep disturbance and suicidal ideas. The patient is not nervous/anxious.     Objective:  There were no vitals taken for this visit.  BP/Weight 07/01/2016 06/21/2016 06/20/2016  Systolic BP 104 105 109  Diastolic BP 65 54 71  Wt. (Lbs) 105 - 95  BMI 15.07 - 13.63  Some encounter information is confidential and restricted. Go to Review Flowsheets activity to see all data.   Wt  Readings from Last 3 Encounters:  07/01/16 105 lb (47.6 kg)  06/20/16 95 lb (43.1 kg)  06/12/16 103 lb 2.8 oz (46.8 kg)    Physical Exam  Constitutional: He appears well-developed. He appears cachectic. No distress.  HENT:  Head: Normocephalic and atraumatic.  Neck: Normal range of motion. Neck supple.  Cardiovascular: Normal rate, regular rhythm, normal heart sounds and intact distal pulses.   Pulmonary/Chest: Effort normal and breath sounds normal.  Musculoskeletal: He exhibits edema (in b/l ankles).  Neurological: He is alert.  Skin: Skin is warm and dry. No rash noted. No erythema.  Psychiatric: He has a normal mood and affect.    Assessment & Plan:  There are no diagnoses linked to this encounter. There are no diagnoses linked to this encounter.  No orders of the defined types were placed in this encounter.   Follow-up: No Follow-up on file.   Dessa PhiJosalyn Valente Fosberg MD

## 2016-08-15 ENCOUNTER — Ambulatory Visit: Payer: Self-pay | Attending: Family Medicine | Admitting: Family Medicine

## 2016-08-15 ENCOUNTER — Encounter: Payer: Self-pay | Admitting: Family Medicine

## 2016-08-15 VITALS — BP 91/56 | HR 57 | Temp 97.6°F | Ht 70.0 in | Wt 106.0 lb

## 2016-08-15 DIAGNOSIS — R630 Anorexia: Secondary | ICD-10-CM | POA: Insufficient documentation

## 2016-08-15 DIAGNOSIS — R636 Underweight: Secondary | ICD-10-CM

## 2016-08-15 DIAGNOSIS — K219 Gastro-esophageal reflux disease without esophagitis: Secondary | ICD-10-CM | POA: Insufficient documentation

## 2016-08-15 DIAGNOSIS — F5000 Anorexia nervosa, unspecified: Secondary | ICD-10-CM

## 2016-08-15 NOTE — Progress Notes (Signed)
Subjective:  Patient ID: Stanley Moon, male    DOB: Feb 08, 1990  Age: 26 y.o. MRN: [id number]  CC: Weight Check   HPI Stanley Moon has anorexia, GERD, depression he  presents for    1. Anorexia: he has history of restricting meals attributing this to GERD and excessive exercise. He has been involuntarily committed at behavioral health in the past. He has been hospitalized in the ICU for weight loss with bradycardia and peripheral edema. He has declined outpatient nutrition referral and behavioral health referral. Today he presents for weight check.   24 hr recall of diet: Vanilla Equate,  8 oz- 350 calories Rice crispy cereal dry, 3-4 cups- 400 calories Unsalted saltines, 1 sleeve- 480 caories Peas, 5 oz - 120 calories Chicken nuggets, 9 - 432 calories 2 bottles of water- 16 oz - 0 calories   Total calculated calories: 1782   He reports this was a moderate eating day. His appetite was not great.  He denies exercising. He reports he likes to walk for circulation and improve his energy.   Social History  Substance Use Topics  . Smoking status: Never Smoker  . Smokeless tobacco: Never Used  . Alcohol use No    Outpatient Medications Prior to Visit  Medication Sig Dispense Refill  . feeding supplement, ENSURE ENLIVE, (ENSURE ENLIVE) LIQD Take 237 mLs by mouth 3 (three) times daily between meals. 237 mL 90  . Multiple Vitamin (MULTIVITAMIN WITH MINERALS) TABS tablet Take 1 tablet by mouth daily. (Patient taking differently: Take 1 tablet by mouth at bedtime. ) 30 tablet -0   No facility-administered medications prior to visit.     ROS Review of Systems  Constitutional: Negative for chills, fatigue, fever and unexpected weight change.  Eyes: Negative for visual disturbance.  Respiratory: Negative for cough and shortness of breath.   Cardiovascular: Negative for chest pain, palpitations and leg swelling.  Gastrointestinal: Negative for abdominal pain, blood in stool,  constipation, diarrhea, nausea and vomiting.  Endocrine: Negative for polydipsia, polyphagia and polyuria.  Musculoskeletal: Negative for arthralgias, back pain, gait problem, myalgias and neck pain.  Skin: Negative for rash.  Allergic/Immunologic: Negative for immunocompromised state.  Hematological: Negative for adenopathy. Does not bruise/bleed easily.  Psychiatric/Behavioral: Negative for dysphoric mood, sleep disturbance and suicidal ideas. The patient is not nervous/anxious.     Objective:  BP (!) 91/56   Pulse (!) 57   Temp 97.6 F (36.4 C) (Oral)   Ht 5\' 10"  (1.778 m)   Wt 106 lb (48.1 kg)   SpO2 100%   BMI 15.21 kg/m   BP/Weight 08/15/2016 07/01/2016 06/21/2016  Systolic BP 91 104 105  Diastolic BP 56 65 54  Wt. (Lbs) 106 105 -  BMI 15.21 15.07 -  Some encounter information is confidential and restricted. Go to Review Flowsheets activity to see all data.   Wt Readings from Last 3 Encounters:  08/15/16 106 lb (48.1 kg)  07/01/16 105 lb (47.6 kg)  06/20/16 95 lb (43.1 kg)    Physical Exam  Constitutional: He appears well-developed. He appears cachectic. No distress.  HENT:  Head: Normocephalic and atraumatic.  Neck: Normal range of motion. Neck supple.  Cardiovascular: Normal rate, regular rhythm, normal heart sounds and intact distal pulses.   Pulmonary/Chest: Effort normal and breath sounds normal.  Musculoskeletal: He exhibits no edema.  Neurological: He is alert.  Skin: Skin is warm and dry. No rash noted. No erythema.  Psychiatric: He has a normal mood and affect.  Assessment & Plan:  Stanley Moon was seen today for weight check.  Diagnoses and all orders for this visit:  Underweight  Anorexia nervosa   There are no diagnoses linked to this encounter.  No orders of the defined types were placed in this encounter.   Follow-up: Return in about 8 weeks (around 10/10/2016) for weight check/meet new PCP.   Dessa PhiJosalyn Naiomi Musto MD

## 2016-08-15 NOTE — Patient Instructions (Addendum)
Diagnoses and all orders for this visit:  Underweight  [name],  You are maintaining your weight Your leg swelling has improved Your heart rate remains slow  Please increase nutritional supplement to twice daily. The best time to take a supplement is 30 minutes before your meal.   Please intake water intake from 32-48 oz to 48-64 oz per day  You may return to work but I do not advise walking to work. Be sure to plan your meals and fluid intake.   F/u in 8 weeks with new primary care provider  Dr. Armen PickupFunches

## 2016-08-15 NOTE — Assessment & Plan Note (Signed)
Patient is underweight. He weight is stable He edema has resolved He remains bradycardic  Plan: Add 1 extra equate a day this will increase calories by 350 Add 1-2 bottles of water to his day  He may return to work but advised not to walk but to use public transportation there and back

## 2016-08-15 NOTE — Assessment & Plan Note (Addendum)
Patient is underweight. He still has restrictive behavior with his diet. He denies excessive exercise  He weight is stable He edema has resolved He remains bradycardic  Plan: Goal normal BMI of at least 18 ideally 20 +  Add 1 extra equate a day this will increase calories by 350 He was given ensure samples  Add 1-2 bottles of water to his day  He may return to work but advised not to walk but to use public transportation there and back

## 2016-10-10 ENCOUNTER — Ambulatory Visit: Payer: Self-pay | Attending: Internal Medicine | Admitting: Internal Medicine

## 2016-10-10 ENCOUNTER — Encounter: Payer: Self-pay | Admitting: Internal Medicine

## 2016-10-10 VITALS — BP 106/68 | HR 88 | Temp 98.0°F | Resp 16 | Wt 110.8 lb

## 2016-10-10 DIAGNOSIS — K2970 Gastritis, unspecified, without bleeding: Secondary | ICD-10-CM | POA: Insufficient documentation

## 2016-10-10 DIAGNOSIS — I959 Hypotension, unspecified: Secondary | ICD-10-CM | POA: Insufficient documentation

## 2016-10-10 DIAGNOSIS — F330 Major depressive disorder, recurrent, mild: Secondary | ICD-10-CM | POA: Insufficient documentation

## 2016-10-10 DIAGNOSIS — E876 Hypokalemia: Secondary | ICD-10-CM | POA: Insufficient documentation

## 2016-10-10 DIAGNOSIS — R001 Bradycardia, unspecified: Secondary | ICD-10-CM | POA: Insufficient documentation

## 2016-10-10 DIAGNOSIS — F5000 Anorexia nervosa, unspecified: Secondary | ICD-10-CM | POA: Insufficient documentation

## 2016-10-10 DIAGNOSIS — R7989 Other specified abnormal findings of blood chemistry: Secondary | ICD-10-CM | POA: Insufficient documentation

## 2016-10-10 DIAGNOSIS — L02223 Furuncle of chest wall: Secondary | ICD-10-CM | POA: Insufficient documentation

## 2016-10-10 DIAGNOSIS — R600 Localized edema: Secondary | ICD-10-CM | POA: Insufficient documentation

## 2016-10-10 DIAGNOSIS — F1290 Cannabis use, unspecified, uncomplicated: Secondary | ICD-10-CM | POA: Insufficient documentation

## 2016-10-10 NOTE — Patient Instructions (Signed)
Please try to get in three meals a day with a small snack (Like a fruit) between meals.   Continue to avoid excessive exercise.

## 2016-10-10 NOTE — Progress Notes (Signed)
Patient ID: Stanley Moon, male    DOB: [birth year]  MRN: [id number]  CC: re-establish; Anorexia; and Depression   Subjective: Stanley Moon is a 26 y.o. male who presents for f/u on anorexia  His concerns today include:  Patient with history of anorexia, depression. Last saw Dr. Armen Pickup 08/2016.  Since last visit he feels that he has been steadily improving  -No pain or fatigue Eating 2 meals a day usually breakfast and dinner. Does not get in lunch because he is out and about most of the times. Prefers not to eat fast foods.   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 Would like to transition back to work at some point -not exercising. Walks to bus stop -mood stable. Sleeping 6-8 hrs a night. Lives with GM.  -wgh up 4 lbs since last visit  Patient Active Problem List   Diagnosis Date Noted  . Depression, major, recurrent, mild (HCC) 06/18/2016  . Protein-calorie malnutrition, severe 06/11/2016  . Marijuana use 06/11/2016  . Hypokalemia 06/11/2016  . Hypotension 06/11/2016  . Lower extremity edema 06/11/2016  . Depression 06/11/2016  . Furuncle of chest wall 06/10/2016  . Bradycardia 06/10/2016  . Elevated LFTs 06/10/2016  . Gastritis 06/10/2016  . Underweight 12/21/2015  . Anorexia nervosa 11/05/2015     Current Outpatient Prescriptions on File Prior to Visit  Medication Sig Dispense Refill  . feeding supplement, ENSURE ENLIVE, (ENSURE ENLIVE) LIQD Take 237 mLs by mouth 3 (three) times daily between meals. (Patient not taking: Reported on 10/10/2016) 237 mL 90  . Multiple Vitamin (MULTIVITAMIN WITH MINERALS) TABS tablet Take 1 tablet by mouth daily. (Patient taking differently: Take 1 tablet by mouth at bedtime. ) 30 tablet -0   No current facility-administered medications on file prior to visit.     Allergies  Allergen Reactions  . Peanuts [Peanut Oil] Anaphylaxis  . Lactose Intolerance (Gi) Nausea And Vomiting     Social History   Social History  . Marital status: Single    Spouse name: N/A  . Number of children: N/A  . Years of education: N/A   Occupational History  . Not on file.   Social History Main Topics  . Smoking status: Never Smoker  . Smokeless tobacco: Never Used  . Alcohol use No  . Drug use: No  . Sexual activity: Not Currently    Birth control/ protection: Condom     Comment: women    Other Topics Concern  . Not on file   Social History Narrative  . No narrative on file    Family History  Problem Relation Age of Onset  . Hypertension Maternal Grandmother     Past Surgical History:  Procedure Laterality Date  . EXTERNAL EAR SURGERY      ROS: Review of Systems Negative except as stated above PHYSICAL EXAM: BP 106/68   Pulse 88   Temp 98 F (36.7 C) (Oral)   Resp 16   Wt 110 lb 12.8 oz (50.3 kg)   SpO2 97%   BMI 15.90 kg/m   Wt Readings from Last 3 Encounters:  10/10/16 110 lb 12.8 oz (50.3 kg)  08/15/16 106 lb (48.1 kg)  07/01/16 105 lb (47.6 kg)    Physical Exam  General appearance -young African-American male who appears underweight. He is in NAD Mental status - alert, oriented to person, place, and time, normal mood, behavior,  Mouth - mucous membranes moist, pharynx normal without lesions Neck -  supple, no significant adenopathy Chest - clear to auscultation, no wheezes, rales or rhonchi, symmetric air entry Heart - normal rate, regular rhythm, normal S1, S2, no murmurs, rubs, clicks or gallops Extremities -wasting in arms and legs. Trace LE edema  ASSESSMENT AND PLAN: 1. Anorexia nervosa -Commended patient on his progress so far. Blood pressure is better, he is no longer bradycardic and weight is up 4 pounds. -Encourage him to get in a third meal of the day even if he has to take his lunch with him. -Recommend snack in between meals Would like to see his BMI above 18 before releasing him to return to work   Patient was given the  opportunity to ask questions.  Patient verbalized understanding of the plan and was able to repeat key elements of the plan.   No orders of the defined types were placed in this encounter.    Requested Prescriptions    No prescriptions requested or ordered in this encounter    Return in about 1 month (around 11/09/2016).  Jonah Blueeborah Mathhew Buysse, MD, FACP

## 2016-10-14 ENCOUNTER — Ambulatory Visit: Payer: Self-pay | Attending: Internal Medicine

## 2020-02-23 ENCOUNTER — Other Ambulatory Visit: Payer: Self-pay

## 2020-02-23 ENCOUNTER — Encounter (HOSPITAL_COMMUNITY): Payer: Self-pay

## 2020-02-23 ENCOUNTER — Emergency Department (HOSPITAL_COMMUNITY)
Admission: EM | Admit: 2020-02-23 | Discharge: 2020-02-23 | Disposition: A | Discharge status: Home / Self Care | Payer: HRSA Program | Attending: Emergency Medicine | Admitting: Emergency Medicine

## 2020-02-23 DIAGNOSIS — Z91010 Allergy to peanuts: Secondary | ICD-10-CM | POA: Diagnosis not present

## 2020-02-23 DIAGNOSIS — U071 COVID-19: Secondary | ICD-10-CM | POA: Diagnosis not present

## 2020-02-23 DIAGNOSIS — J45909 Unspecified asthma, uncomplicated: Secondary | ICD-10-CM | POA: Insufficient documentation

## 2020-02-23 DIAGNOSIS — R519 Headache, unspecified: Secondary | ICD-10-CM | POA: Diagnosis present

## 2020-02-23 MED ORDER — BENZONATATE 100 MG PO CAPS: 100.0000 mg | ORAL_CAPSULE | Freq: Three times a day (TID) | ORAL | 0 refills | Status: AC

## 2020-02-23 MED ORDER — ONDANSETRON 8 MG PO TBDP: 8.0000 mg | ORAL_TABLET | Freq: Three times a day (TID) | ORAL | 0 refills | Status: AC | PRN

## 2020-02-23 NOTE — ED Provider Notes (Signed)
[facility] EMERGENCY DEPARTMENT Provider Note   CSN: [id number] Arrival date & time: 02/23/20  0300     History Chief Complaint  Patient presents with  . Covid Positive    Stanley Moon is a 30 y.o. male.  HPI   Pt had a routine covid test for work on the 11th.  On the 12th he started having headaches. He started having body aches. He tried taking theraflu. He got the result on the 14th that he was positive.   Pt has been coughing, and having body aches.  He  Has noticed small bumps on his skin.  He also noticed red marks after he takes a shower. It will then go away.  Pt is not taking anything. No vomiting recently.    Pt also got vaccinated on the 17th.  He did not tell him that he had an active covid infection.  Past Medical History:  Diagnosis Date  . Anorexia   . Anxiety   . Asthma    as a child, "I haven't used inhalers in years"  . Bradycardia   . Depression   . Eczema   . Syphilis 08/29/2013    Patient Active Problem List   Diagnosis Date Noted  . Protein-calorie malnutrition, severe 06/11/2016  . Marijuana use 06/11/2016  . Lower extremity edema 06/11/2016  . Depression 06/11/2016  . Bradycardia 06/10/2016  . Elevated LFTs 06/10/2016  . Underweight 12/21/2015  . Anorexia nervosa 11/05/2015    Past Surgical History:  Procedure Laterality Date  . EXTERNAL EAR SURGERY         Family History  Problem Relation Age of Onset  . Hypertension Maternal Grandmother     Social History   Tobacco Use  . Smoking status: Never Smoker  . Smokeless tobacco: Never Used  Vaping Use  . Vaping Use: Never used  Substance Use Topics  . Alcohol use: No  . Drug use: No    Home Medications Prior to Admission medications   Medication Sig Start Date End Date Taking? Authorizing Provider  benzonatate (TESSALON) 100 MG capsule Take 1 capsule (100 mg total) by mouth every 8 (eight) hours. 02/23/20  Yes Linwood Dibbles, MD  ondansetron (ZOFRAN ODT) 8 MG  disintegrating tablet Take 1 tablet (8 mg total) by mouth every 8 (eight) hours as needed for nausea or vomiting. 02/23/20  Yes Linwood Dibbles, MD  feeding supplement, ENSURE ENLIVE, (ENSURE ENLIVE) LIQD Take 237 mLs by mouth 3 (three) times daily between meals. Patient not taking: Reported on 10/10/2016 06/21/16   Charm Rings, NP  Multiple Vitamin (MULTIVITAMIN WITH MINERALS) TABS tablet Take 1 tablet by mouth daily. Patient taking differently: Take 1 tablet by mouth at bedtime.  06/13/16   Marguerita Merles Latif, DO    Allergies    Peanuts [peanut oil] and Lactose intolerance (gi)  Review of Systems   Review of Systems  All other systems reviewed and are negative.   Physical Exam Updated Vital Signs BP 124/80   Pulse 61   Temp 98.6 F (37 C) (Oral)   Resp 18   Ht 1.778 m (5\' 10" )   Wt 63.5 kg   SpO2 100%   BMI 20.09 kg/m   Physical Exam Vitals and nursing note reviewed.  Constitutional:      General: He is not in acute distress.    Appearance: He is well-developed and well-nourished.  HENT:     Head: Normocephalic and atraumatic.     Right Ear: External  ear normal.     Left Ear: External ear normal.  Eyes:     General: No scleral icterus.       Right eye: No discharge.        Left eye: No discharge.     Conjunctiva/sclera: Conjunctivae normal.  Neck:     Trachea: No tracheal deviation.  Cardiovascular:     Rate and Rhythm: Normal rate and regular rhythm.     Pulses: Intact distal pulses.  Pulmonary:     Effort: Pulmonary effort is normal. No respiratory distress.     Breath sounds: Normal breath sounds. No stridor. No wheezing or rales.  Abdominal:     General: Bowel sounds are normal. There is no distension.     Palpations: Abdomen is soft.     Tenderness: There is no abdominal tenderness. There is no guarding or rebound.  Musculoskeletal:        General: No tenderness or edema.     Cervical back: Neck supple.  Skin:    General: Skin is warm and dry.      Findings: No rash.  Neurological:     Mental Status: He is alert.     Cranial Nerves: No cranial nerve deficit (no facial droop, extraocular movements intact, no slurred speech).     Sensory: No sensory deficit.     Motor: No abnormal muscle tone or seizure activity.     Coordination: Coordination normal.     Deep Tendon Reflexes: Strength normal.  Psychiatric:        Mood and Affect: Mood and affect normal.     ED Results / Procedures / Treatments   Labs (all labs ordered are listed, but only abnormal results are displayed) Labs Reviewed - No data to display  EKG None  Radiology No results found.  Procedures Procedures (including critical care time)  Medications Ordered in ED Medications - No data to display  ED Course  I have reviewed the triage vital signs and the nursing notes.  Pertinent labs & imaging results that were available during my care of the patient were reviewed by me and considered in my medical decision making (see chart for details).    MDM Rules/Calculators/A&P                          Patient presented to ED with persistent symptoms associated with a COVID-19 diagnosis.  Patient was diagnosed with COVID on the 14th.  He ended up getting vaccinated on the 17th.  Patient did not inform anyone that he had covid.  It is possible the patient may be having more symptoms considering he did just get vaccinated after a positive COVID test.  Fortunately however the patient's not having any breathing issues.  No signs of any phylactic type reaction.  He is not hypoxic.  Patient otherwise appears hemodynamically stable.  Discussed supportive care at home. Final Clinical Impression(s) / ED Diagnoses Final diagnoses:  COVID-19 virus infection    Rx / DC Orders ED Discharge Orders         Ordered    ondansetron (ZOFRAN ODT) 8 MG disintegrating tablet  Every 8 hours PRN        02/23/20 1228    benzonatate (TESSALON) 100 MG capsule  Every 8 hours        02/23/20  1228           Linwood Dibbles, MD 02/23/20 1229

## 2020-02-23 NOTE — ED Triage Notes (Signed)
Patient arrives from home with [facility], reports he was taking theraflu before he knew he was positive and thinks he is reacting to it, states he was dx COVID + on 1/14 and then got the moderna vax on 1/17. Report he is now feeling worse.

## 2020-02-23 NOTE — Discharge Instructions (Signed)
Take the medications as needed to help with your symptoms.  Continue to monitor for worsening symptoms such as difficulty breathing.

## 2020-03-02 ENCOUNTER — Ambulatory Visit (HOSPITAL_COMMUNITY)
Admission: EM | Admit: 2020-03-02 | Discharge: 2020-03-02 | Disposition: A | Discharge status: Home / Self Care | Payer: Self-pay | Attending: Emergency Medicine | Admitting: Emergency Medicine

## 2020-03-02 ENCOUNTER — Other Ambulatory Visit: Payer: Self-pay

## 2020-03-02 ENCOUNTER — Encounter (HOSPITAL_COMMUNITY): Payer: Self-pay

## 2020-03-02 ENCOUNTER — Ambulatory Visit (INDEPENDENT_AMBULATORY_CARE_PROVIDER_SITE_OTHER): Payer: Self-pay

## 2020-03-02 DIAGNOSIS — R059 Cough, unspecified: Secondary | ICD-10-CM | POA: Diagnosis not present

## 2020-03-02 DIAGNOSIS — U071 COVID-19: Secondary | ICD-10-CM

## 2020-03-02 DIAGNOSIS — R0781 Pleurodynia: Secondary | ICD-10-CM

## 2020-03-02 MED ORDER — IBUPROFEN 600 MG PO TABS: 600.0000 mg | ORAL_TABLET | Freq: Four times a day (QID) | ORAL | 0 refills | Status: AC | PRN

## 2020-03-02 NOTE — Discharge Instructions (Signed)
Take ibuprofen as needed for discomfort.    Follow up with your primary care provider if your symptoms are not improving.

## 2020-03-02 NOTE — ED Provider Notes (Signed)
[facility]    CSN: [id number] Arrival date & time: 03/02/20  1833      History   Chief Complaint Chief Complaint  Patient presents with  . Flank Pain    HPI Stanley Moon is a 30 y.o. male.   Patient presents with cough and pain in his left rib cage.  He states he was told at [facility] department that he needed to come here for a chest x-ray.  He denies fever, chills, chest pain, shortness of breath, or other symptoms.  He had a positive COVID test on 02/14/2020.  He was seen in the ED on 02/23/2020; diagnosed with COVID-19; treated with Zofran and Tessalon Perles.  His medical history includes asthma, eczema, anxiety, depression, anorexia nervosa, protein-calorie malnutrition, syphilis.  The history is provided by the patient and medical records.    Past Medical History:  Diagnosis Date  . Anorexia   . Anxiety   . Asthma    as a child, "I haven't used inhalers in years"  . Bradycardia   . Depression   . Eczema   . Syphilis 08/29/2013    Patient Active Problem List   Diagnosis Date Noted  . Protein-calorie malnutrition, severe 06/11/2016  . Marijuana use 06/11/2016  . Lower extremity edema 06/11/2016  . Depression 06/11/2016  . Bradycardia 06/10/2016  . Elevated LFTs 06/10/2016  . Underweight 12/21/2015  . Anorexia nervosa 11/05/2015    Past Surgical History:  Procedure Laterality Date  . EXTERNAL EAR SURGERY         Home Medications    Prior to Admission medications   Medication Sig Start Date End Date Taking? Authorizing Provider  ibuprofen (ADVIL) 600 MG tablet Take 1 tablet (600 mg total) by mouth every 6 (six) hours as needed. 03/02/20  Yes Mickie Bail, NP  benzonatate (TESSALON) 100 MG capsule Take 1 capsule (100 mg total) by mouth every 8 (eight) hours. 02/23/20   Linwood Dibbles, MD  feeding supplement, ENSURE ENLIVE, (ENSURE ENLIVE) LIQD Take 237 mLs by mouth 3 (three) times daily between meals. Patient not taking: Reported on  10/10/2016 06/21/16   Charm Rings, NP  Multiple Vitamin (MULTIVITAMIN WITH MINERALS) TABS tablet Take 1 tablet by mouth daily. Patient taking differently: Take 1 tablet by mouth at bedtime.  06/13/16   Marguerita Merles Latif, DO  ondansetron (ZOFRAN ODT) 8 MG disintegrating tablet Take 1 tablet (8 mg total) by mouth every 8 (eight) hours as needed for nausea or vomiting. 02/23/20   Linwood Dibbles, MD    Family History Family History  Problem Relation Age of Onset  . Hypertension Maternal Grandmother     Social History Social History   Tobacco Use  . Smoking status: Never Smoker  . Smokeless tobacco: Never Used  Vaping Use  . Vaping Use: Never used  Substance Use Topics  . Alcohol use: No  . Drug use: No     Allergies   Peanuts [peanut oil] and Lactose intolerance (gi)   Review of Systems Review of Systems  Constitutional: Negative for chills and fever.  HENT: Negative for ear pain and sore throat.   Eyes: Negative for pain and visual disturbance.  Respiratory: Positive for cough. Negative for shortness of breath.        Pain in left rib cage.  Cardiovascular: Negative for chest pain and palpitations.  Gastrointestinal: Negative for abdominal pain and vomiting.  Genitourinary: Negative for dysuria and hematuria.  Musculoskeletal: Negative for arthralgias and back pain.  Skin: Negative for color change and rash.  Neurological: Negative for seizures and syncope.  All other systems reviewed and are negative.    Physical Exam Triage Vital Signs ED Triage Vitals  Enc Vitals Group     BP      Pulse      Resp      Temp      Temp src      SpO2      Weight      Height      Head Circumference      Peak Flow      Pain Score      Pain Loc      Pain Edu?      Excl. in GC?    No data found.  Updated Vital Signs BP 110/74 (BP Location: Left Arm)   Pulse 73   Temp 98.3 F (36.8 C) (Oral)   Resp 18   SpO2 98%   Visual Acuity Right Eye Distance:   Left Eye  Distance:   Bilateral Distance:    Right Eye Near:   Left Eye Near:    Bilateral Near:     Physical Exam Vitals and nursing note reviewed.  Constitutional:      General: He is not in acute distress.    Appearance: He is well-developed and well-nourished.  HENT:     Head: Normocephalic and atraumatic.     Mouth/Throat:     Mouth: Mucous membranes are moist.  Eyes:     Conjunctiva/sclera: Conjunctivae normal.  Cardiovascular:     Rate and Rhythm: Normal rate and regular rhythm.     Heart sounds: Normal heart sounds.  Pulmonary:     Effort: Pulmonary effort is normal. No respiratory distress.     Breath sounds: Normal breath sounds.  Abdominal:     Palpations: Abdomen is soft.     Tenderness: There is no abdominal tenderness. There is no right CVA tenderness, left CVA tenderness, guarding or rebound.  Musculoskeletal:        General: Tenderness present. No swelling, deformity or edema. Normal range of motion.     Cervical back: Neck supple.     Comments: Left lateral rib cage mildly tender to palpation.  Skin:    General: Skin is warm and dry.     Findings: No bruising, erythema, lesion or rash.  Neurological:     General: No focal deficit present.     Mental Status: He is alert and oriented to person, place, and time.     Gait: Gait normal.  Psychiatric:        Mood and Affect: Mood and affect and mood normal.        Behavior: Behavior normal.      UC Treatments / Results  Labs (all labs ordered are listed, but only abnormal results are displayed) Labs Reviewed - No data to display  EKG   Radiology DG Chest 2 View  Result Date: 03/02/2020 CLINICAL DATA:  Cough, COVID EXAM: CHEST - 2 VIEW COMPARISON:  06/10/2016 FINDINGS: The heart size and mediastinal contours are within normal limits. Both lungs are clear. The visualized skeletal structures are unremarkable. IMPRESSION: Negative. Electronically Signed   By: Charlett Nose M.D.   On: 03/02/2020 19:25     Procedures Procedures (including critical care time)  Medications Ordered in UC Medications - No data to display  Initial Impression / Assessment and Plan / UC Course  I have reviewed the triage vital signs and  the nursing notes.  Pertinent labs & imaging results that were available during my care of the patient were reviewed by me and considered in my medical decision making (see chart for details).   Cough and left rib pain.  Patient is well-appearing and his exam is reassuring.  Chest x-ray negative.  Treating discomfort with ibuprofen.  Him to follow-up with his PCP if his symptoms are not improving.  He agrees to plan of care.   Final Clinical Impressions(s) / UC Diagnoses   Final diagnoses:  Cough  Rib pain on left side     Discharge Instructions     Take ibuprofen as needed for discomfort.    Follow up with your primary care provider if your symptoms are not improving.        ED Prescriptions    Medication Sig Dispense Auth. Provider   ibuprofen (ADVIL) 600 MG tablet Take 1 tablet (600 mg total) by mouth every 6 (six) hours as needed. 30 tablet Mickie Bail, NP     PDMP not reviewed this encounter.   Mickie Bail, NP 03/02/20 [phone]

## 2020-03-02 NOTE — ED Triage Notes (Signed)
Pt presents with left side pain X 2 days. Pt states he describes his pain as a sharp pinching pain when inhaling. Pt states the [facility] Department told him he might need an X-ray.

## 2022-09-08 IMAGING — DX DG CHEST 2V
2 series · 2 of 2 positions shown · non-contrast
Comparison: 06/10/2016

CLINICAL DATA: Cough, COVID

EXAM:
CHEST - 2 VIEW

[chest pa]
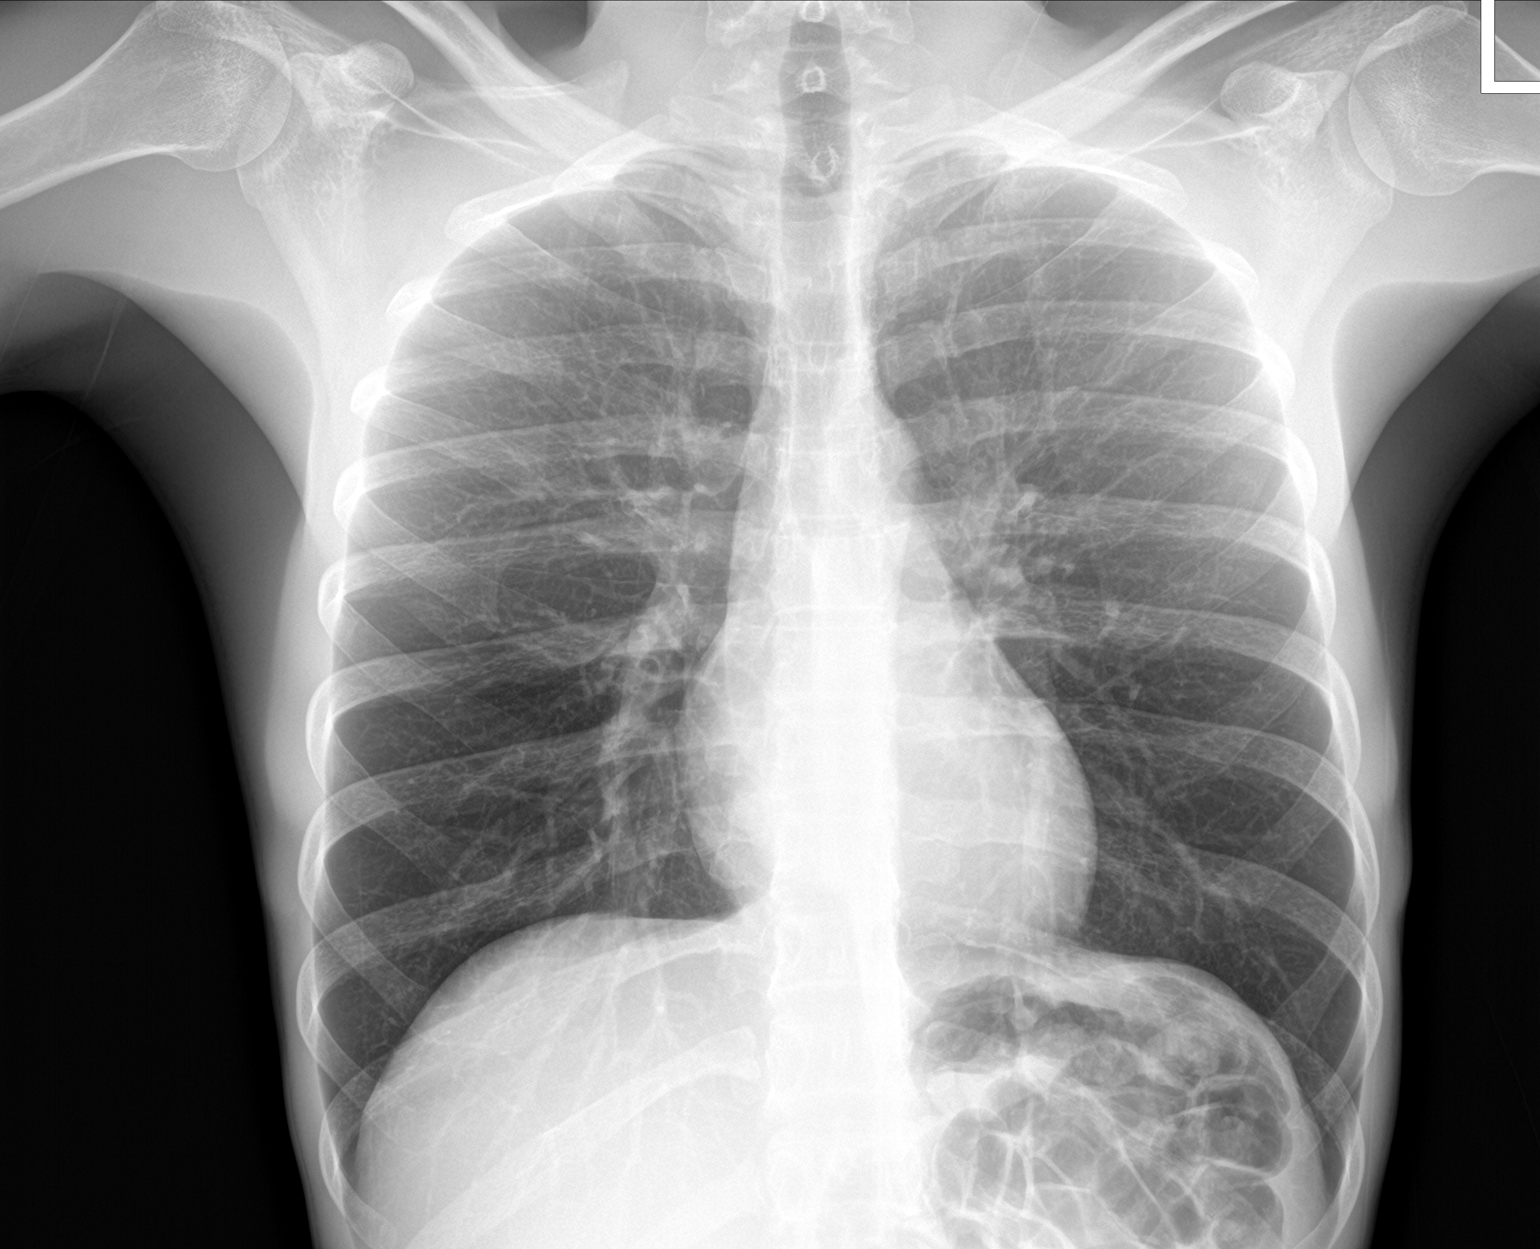

[chest lat]
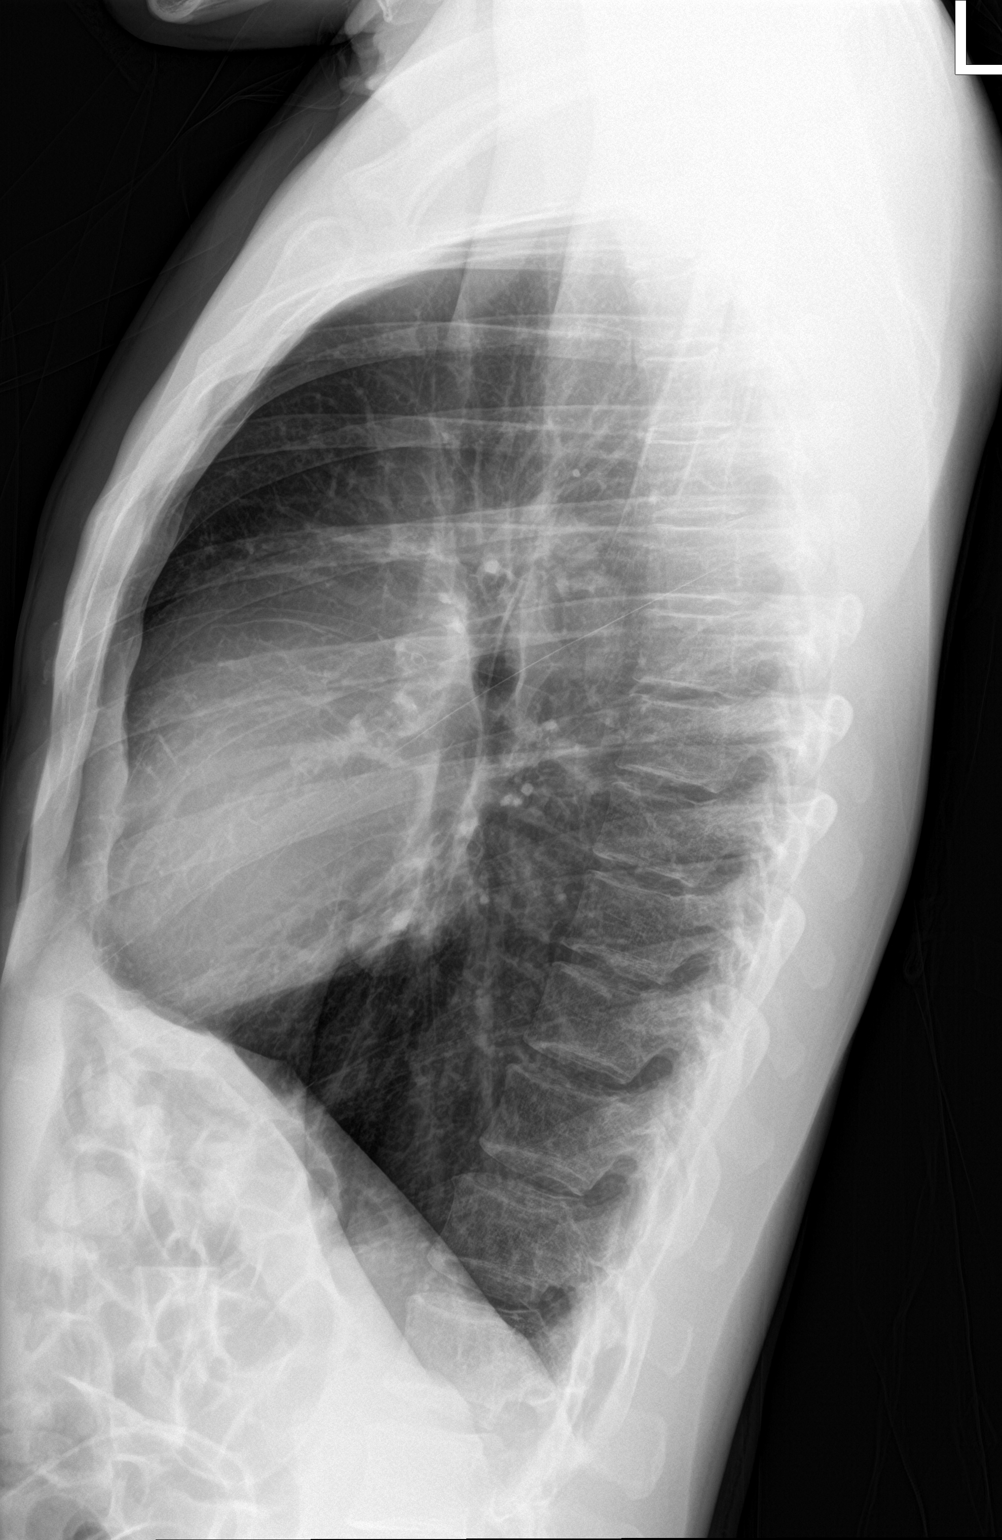

[2 of 2 positions shown; findings below may reference images not displayed]

FINDINGS: The heart size and mediastinal contours are within normal limits.
Both lungs are clear. The visualized skeletal structures are
unremarkable.
IMPRESSION: Negative.
